# Patient Record
Sex: Male | Born: 1985 | Race: White | Hispanic: No | Marital: Married | Smoking: Current every day smoker
Health system: Southern US, Community
[De-identification: ages and names within clinical notes are randomized; demographics above are authoritative.]

## PROBLEM LIST (undated history)

## (undated) DIAGNOSIS — R12 Heartburn: Secondary | ICD-10-CM

## (undated) DIAGNOSIS — L309 Dermatitis, unspecified: Secondary | ICD-10-CM

---

## 2017-08-19 ENCOUNTER — Inpatient Hospital Stay (HOSPITAL_COMMUNITY): Payer: PRIVATE HEALTH INSURANCE

## 2017-08-19 ENCOUNTER — Inpatient Hospital Stay (HOSPITAL_COMMUNITY)
Admission: EM | Admit: 2017-08-19 | Discharge: 2017-08-26 | DRG: 682 | Disposition: A | Discharge status: Home / Self Care | Payer: PRIVATE HEALTH INSURANCE | Attending: Family Medicine | Admitting: Family Medicine

## 2017-08-19 ENCOUNTER — Emergency Department (HOSPITAL_COMMUNITY): Payer: PRIVATE HEALTH INSURANCE

## 2017-08-19 ENCOUNTER — Encounter (HOSPITAL_COMMUNITY): Payer: Self-pay | Admitting: Emergency Medicine

## 2017-08-19 DIAGNOSIS — R579 Shock, unspecified: Secondary | ICD-10-CM | POA: Diagnosis present

## 2017-08-19 DIAGNOSIS — E872 Acidosis, unspecified: Secondary | ICD-10-CM

## 2017-08-19 DIAGNOSIS — R0603 Acute respiratory distress: Secondary | ICD-10-CM

## 2017-08-19 DIAGNOSIS — R0789 Other chest pain: Secondary | ICD-10-CM | POA: Diagnosis present

## 2017-08-19 DIAGNOSIS — L309 Dermatitis, unspecified: Secondary | ICD-10-CM | POA: Diagnosis present

## 2017-08-19 DIAGNOSIS — R17 Unspecified jaundice: Secondary | ICD-10-CM | POA: Diagnosis not present

## 2017-08-19 DIAGNOSIS — D696 Thrombocytopenia, unspecified: Secondary | ICD-10-CM | POA: Diagnosis present

## 2017-08-19 DIAGNOSIS — J9600 Acute respiratory failure, unspecified whether with hypoxia or hypercapnia: Secondary | ICD-10-CM

## 2017-08-19 DIAGNOSIS — Z992 Dependence on renal dialysis: Secondary | ICD-10-CM

## 2017-08-19 DIAGNOSIS — N179 Acute kidney failure, unspecified: Secondary | ICD-10-CM | POA: Diagnosis present

## 2017-08-19 DIAGNOSIS — K219 Gastro-esophageal reflux disease without esophagitis: Secondary | ICD-10-CM | POA: Diagnosis present

## 2017-08-19 DIAGNOSIS — R778 Other specified abnormalities of plasma proteins: Secondary | ICD-10-CM

## 2017-08-19 DIAGNOSIS — R112 Nausea with vomiting, unspecified: Secondary | ICD-10-CM

## 2017-08-19 DIAGNOSIS — E871 Hypo-osmolality and hyponatremia: Secondary | ICD-10-CM | POA: Diagnosis present

## 2017-08-19 DIAGNOSIS — I340 Nonrheumatic mitral (valve) insufficiency: Secondary | ICD-10-CM | POA: Diagnosis present

## 2017-08-19 DIAGNOSIS — D649 Anemia, unspecified: Secondary | ICD-10-CM

## 2017-08-19 DIAGNOSIS — R12 Heartburn: Secondary | ICD-10-CM | POA: Diagnosis present

## 2017-08-19 DIAGNOSIS — Z452 Encounter for adjustment and management of vascular access device: Secondary | ICD-10-CM

## 2017-08-19 DIAGNOSIS — K7460 Unspecified cirrhosis of liver: Secondary | ICD-10-CM | POA: Diagnosis present

## 2017-08-19 DIAGNOSIS — F17210 Nicotine dependence, cigarettes, uncomplicated: Secondary | ICD-10-CM | POA: Diagnosis present

## 2017-08-19 DIAGNOSIS — R748 Abnormal levels of other serum enzymes: Secondary | ICD-10-CM | POA: Diagnosis not present

## 2017-08-19 DIAGNOSIS — E875 Hyperkalemia: Secondary | ICD-10-CM | POA: Diagnosis present

## 2017-08-19 DIAGNOSIS — I514 Myocarditis, unspecified: Secondary | ICD-10-CM | POA: Diagnosis present

## 2017-08-19 DIAGNOSIS — K7200 Acute and subacute hepatic failure without coma: Secondary | ICD-10-CM | POA: Diagnosis present

## 2017-08-19 DIAGNOSIS — Z79891 Long term (current) use of opiate analgesic: Secondary | ICD-10-CM | POA: Diagnosis not present

## 2017-08-19 DIAGNOSIS — R7989 Other specified abnormal findings of blood chemistry: Secondary | ICD-10-CM

## 2017-08-19 DIAGNOSIS — E8770 Fluid overload, unspecified: Secondary | ICD-10-CM | POA: Diagnosis present

## 2017-08-19 DIAGNOSIS — R197 Diarrhea, unspecified: Secondary | ICD-10-CM

## 2017-08-19 DIAGNOSIS — I959 Hypotension, unspecified: Secondary | ICD-10-CM

## 2017-08-19 DIAGNOSIS — K7011 Alcoholic hepatitis with ascites: Secondary | ICD-10-CM | POA: Diagnosis present

## 2017-08-19 HISTORY — DX: Dermatitis, unspecified: L30.9

## 2017-08-19 HISTORY — DX: Heartburn: R12

## 2017-08-19 LAB — BASIC METABOLIC PANEL
Anion gap: 36 — ABNORMAL HIGH (ref 5–15)
Anion gap: 25 — ABNORMAL HIGH (ref 5–15)
BUN: 62 mg/dL — AB (ref 6–20)
BUN: 64 mg/dL — AB (ref 6–20)
CHLORIDE: 68 mmol/L — AB (ref 101–111)
CO2: 8 mmol/L — AB (ref 22–32)
CO2: 22 mmol/L (ref 22–32)
CREATININE: 5.9 mg/dL — AB (ref 0.61–1.24)
CREATININE: 5.72 mg/dL — AB (ref 0.61–1.24)
Calcium: 9.6 mg/dL (ref 8.9–10.3)
Calcium: 8.8 mg/dL — ABNORMAL LOW (ref 8.9–10.3)
Chloride: 70 mmol/L — ABNORMAL LOW (ref 101–111)
GFR calc Af Amer: 14 mL/min — ABNORMAL LOW (ref >60)
GFR calc non Af Amer: 12 mL/min — ABNORMAL LOW (ref >60)
GFR, EST AFRICAN AMERICAN: 13 mL/min — AB (ref >60)
GFR, EST NON AFRICAN AMERICAN: 12 mL/min — AB (ref >60)
GLUCOSE: 162 mg/dL — AB (ref 65–99)
Glucose, Bld: 122 mg/dL — ABNORMAL HIGH (ref 65–99)
Potassium: 6.3 mmol/L (ref 3.5–5.1)
Potassium: 4.5 mmol/L (ref 3.5–5.1)
SODIUM: 117 mmol/L — AB (ref 135–145)
Sodium: 112 mmol/L — CL (ref 135–145)

## 2017-08-19 LAB — CBC WITH DIFFERENTIAL/PLATELET
BASOS ABS: 0 10*3/uL (ref 0.0–0.1)
Basophils Relative: 0 %
EOS PCT: 0 %
Eosinophils Absolute: 0 10*3/uL (ref 0.0–0.7)
HEMATOCRIT: 34.9 % — AB (ref 39.0–52.0)
HEMOGLOBIN: 12.2 g/dL — AB (ref 13.0–17.0)
LYMPHS ABS: 1.3 10*3/uL (ref 0.7–4.0)
LYMPHS PCT: 8 %
MCH: 34.3 pg — AB (ref 26.0–34.0)
MCHC: 35 g/dL (ref 30.0–36.0)
MCV: 98 fL (ref 78.0–100.0)
Monocytes Absolute: 1.5 10*3/uL — ABNORMAL HIGH (ref 0.1–1.0)
Monocytes Relative: 9 %
NEUTROS ABS: 12.8 10*3/uL — AB (ref 1.7–7.7)
Neutrophils Relative %: 83 %
PLATELETS: 348 10*3/uL (ref 150–400)
RBC: 3.56 MIL/uL — AB (ref 4.22–5.81)
RDW: 13.4 % (ref 11.5–15.5)
WBC: 15.5 10*3/uL — AB (ref 4.0–10.5)

## 2017-08-19 LAB — APTT: APTT: 40 s — AB (ref 24–36)

## 2017-08-19 LAB — POCT I-STAT 3, ART BLOOD GAS (G3+)
ACID-BASE DEFICIT: 5 mmol/L — AB (ref 0.0–2.0)
BICARBONATE: 14.3 mmol/L — AB (ref 20.0–28.0)
O2 SAT: 99 %
PCO2 ART: 16.8 mmHg — AB (ref 32.0–48.0)
PO2 ART: 109 mmHg — AB (ref 83.0–108.0)
Patient temperature: 36.7
TCO2: 15 mmol/L — ABNORMAL LOW (ref 22–32)
pH, Arterial: 7.537 — ABNORMAL HIGH (ref 7.350–7.450)

## 2017-08-19 LAB — COMPREHENSIVE METABOLIC PANEL
AST: <5 U/L — ABNORMAL LOW (ref 15–41)
Albumin: 3.4 g/dL — ABNORMAL LOW (ref 3.5–5.0)
Alkaline Phosphatase: 60 U/L (ref 38–126)
BUN: 59 mg/dL — ABNORMAL HIGH (ref 6–20)
CALCIUM: 9.9 mg/dL (ref 8.9–10.3)
CHLORIDE: 69 mmol/L — AB (ref 101–111)
Creatinine, Ser: 5.89 mg/dL — ABNORMAL HIGH (ref 0.61–1.24)
GFR calc non Af Amer: 12 mL/min — ABNORMAL LOW (ref >60)
GFR, EST AFRICAN AMERICAN: 13 mL/min — AB (ref >60)
GLUCOSE: 107 mg/dL — AB (ref 65–99)
Potassium: 5.7 mmol/L — ABNORMAL HIGH (ref 3.5–5.1)
SODIUM: 113 mmol/L — AB (ref 135–145)
Total Bilirubin: 8.8 mg/dL — ABNORMAL HIGH (ref 0.3–1.2)
Total Protein: 6.7 g/dL (ref 6.5–8.1)

## 2017-08-19 LAB — I-STAT CG4 LACTIC ACID, ED
Lactic Acid, Venous: >17 mmol/L (ref 0.5–1.9)
Lactic Acid, Venous: >17 mmol/L (ref 0.5–1.9)

## 2017-08-19 LAB — AMMONIA: Ammonia: 54 umol/L — ABNORMAL HIGH (ref 9–35)

## 2017-08-19 LAB — OSMOLALITY: OSMOLALITY: 268 mosm/kg — AB (ref 275–295)

## 2017-08-19 LAB — PROTIME-INR
INR: 2.42
Prothrombin Time: 26.1 seconds — ABNORMAL HIGH (ref 11.4–15.2)

## 2017-08-19 LAB — ABO/RH: ABO/RH(D): A POS

## 2017-08-19 LAB — I-STAT TROPONIN, ED: Troponin i, poc: 0.27 ng/mL (ref 0.00–0.08)

## 2017-08-19 LAB — DIC (DISSEMINATED INTRAVASCULAR COAGULATION)PANEL
D-Dimer, Quant: 1.44 ug/mL-FEU — ABNORMAL HIGH (ref 0.00–0.50)
Platelets: 317 10*3/uL (ref 150–400)
aPTT: 41 seconds — ABNORMAL HIGH (ref 24–36)

## 2017-08-19 LAB — TYPE AND SCREEN
ABO/RH(D): A POS
ANTIBODY SCREEN: NEGATIVE

## 2017-08-19 LAB — GLUCOSE, CAPILLARY
GLUCOSE-CAPILLARY: 150 mg/dL — AB (ref 65–99)
Glucose-Capillary: 125 mg/dL — ABNORMAL HIGH (ref 65–99)

## 2017-08-19 LAB — ACETAMINOPHEN LEVEL: Acetaminophen (Tylenol), Serum: <10 ug/mL — ABNORMAL LOW (ref 10–30)

## 2017-08-19 LAB — DIC (DISSEMINATED INTRAVASCULAR COAGULATION) PANEL
FIBRINOGEN: 156 mg/dL — AB (ref 210–475)
INR: 2.53
PROTHROMBIN TIME: 27 s — AB (ref 11.4–15.2)
SMEAR REVIEW: NONE SEEN

## 2017-08-19 LAB — MAGNESIUM: Magnesium: 2 mg/dL (ref 1.7–2.4)

## 2017-08-19 LAB — PHOSPHORUS: Phosphorus: 8.7 mg/dL — ABNORMAL HIGH (ref 2.5–4.6)

## 2017-08-19 LAB — MRSA PCR SCREENING: MRSA BY PCR: NEGATIVE

## 2017-08-19 LAB — BILIRUBIN, DIRECT: BILIRUBIN DIRECT: 4 mg/dL — AB (ref 0.1–0.5)

## 2017-08-19 LAB — LACTATE DEHYDROGENASE: LDH: 422 U/L — AB (ref 98–192)

## 2017-08-19 LAB — LIPASE, BLOOD: LIPASE: 84 U/L — AB (ref 11–51)

## 2017-08-19 LAB — BRAIN NATRIURETIC PEPTIDE: B NATRIURETIC PEPTIDE 5: 3252.8 pg/mL — AB (ref 0.0–100.0)

## 2017-08-19 LAB — ETHANOL: Alcohol, Ethyl (B): <5 mg/dL (ref <5)

## 2017-08-19 MED ORDER — VITAMIN K1 10 MG/ML IJ SOLN
10.0000 mg | Freq: Once | INTRAVENOUS | Status: AC
  Administered 2017-08-19: 10 mg via INTRAVENOUS
  Filled 2017-08-19: qty 1

## 2017-08-19 MED ORDER — ACETYLCYSTEINE LOAD VIA INFUSION
150.0000 mg/kg | Freq: Once | INTRAVENOUS | Status: DC
Start: 2017-08-19 — End: 2017-08-19

## 2017-08-19 MED ORDER — DEXTROSE 5 % IV SOLN
INTRAVENOUS | Status: DC
  Filled 2017-08-19: qty 1000

## 2017-08-19 MED ORDER — FENTANYL CITRATE (PF) 100 MCG/2ML IJ SOLN
75.0000 ug | INTRAMUSCULAR | Status: DC | PRN
  Administered 2017-08-19 (×2): 75 ug via INTRAVENOUS
  Administered 2017-08-19: 25 ug via INTRAVENOUS
  Administered 2017-08-19 – 2017-08-23 (×17): 75 ug via INTRAVENOUS
  Filled 2017-08-19 (×21): qty 2

## 2017-08-19 MED ORDER — DEXTROSE 5 % IV SOLN: 12.5000 mg/kg/h | INTRAVENOUS | Status: DC

## 2017-08-19 MED ORDER — ACETYLCYSTEINE LOAD VIA INFUSION: 150.0000 mg/kg | Freq: Once | INTRAVENOUS | Status: DC

## 2017-08-19 MED ORDER — FAMOTIDINE IN NACL 20-0.9 MG/50ML-% IV SOLN
20.0000 mg | Freq: Once | INTRAVENOUS | Status: AC
Start: 2017-08-19 — End: 2017-08-19
  Administered 2017-08-19: 20 mg via INTRAVENOUS
  Filled 2017-08-19: qty 50

## 2017-08-19 MED ORDER — SODIUM CHLORIDE 0.9 % IV SOLN
0.0000 ug/min | INTRAVENOUS | Status: DC
  Administered 2017-08-19 (×2): 280 ug/min via INTRAVENOUS
  Administered 2017-08-20: 30 ug/min via INTRAVENOUS
  Administered 2017-08-20: 260 ug/min via INTRAVENOUS
  Administered 2017-08-20: 230 ug/min via INTRAVENOUS
  Administered 2017-08-20: 170 ug/min via INTRAVENOUS
  Filled 2017-08-19 (×6): qty 4

## 2017-08-19 MED ORDER — PANTOPRAZOLE SODIUM 40 MG IV SOLR
40.0000 mg | Freq: Every day | INTRAVENOUS | Status: DC
  Administered 2017-08-19 – 2017-08-21 (×3): 40 mg via INTRAVENOUS
  Filled 2017-08-19 (×4): qty 40

## 2017-08-19 MED ORDER — ONDANSETRON HCL 4 MG/2ML IJ SOLN
4.0000 mg | Freq: Once | INTRAMUSCULAR | Status: AC
  Administered 2017-08-19: 4 mg via INTRAVENOUS
  Filled 2017-08-19: qty 2

## 2017-08-19 MED ORDER — ACETYLCYSTEINE LOAD VIA INFUSION
150.0000 mg/kg | Freq: Once | INTRAVENOUS | Status: AC
  Administered 2017-08-19: 10200 mg via INTRAVENOUS
  Filled 2017-08-19: qty 255

## 2017-08-19 MED ORDER — STERILE WATER FOR INJECTION IV SOLN
INTRAVENOUS | Status: DC
  Administered 2017-08-19: 15:00:00 via INTRAVENOUS
  Filled 2017-08-19 (×4): qty 850

## 2017-08-19 MED ORDER — TAB-A-VITE/IRON PO TABS
1.0000 | ORAL_TABLET | Freq: Every day | ORAL | Status: DC
  Administered 2017-08-21 – 2017-08-26 (×6): 1 via ORAL
  Filled 2017-08-19 (×8): qty 1

## 2017-08-19 MED ORDER — DEXTROSE 5 % IV SOLN
INTRAVENOUS | Status: AC
  Administered 2017-08-19 – 2017-08-22 (×2): via INTRAVENOUS
  Filled 2017-08-19 (×2): qty 800

## 2017-08-19 MED ORDER — DEXTROSE 5 % IV SOLN: 15.0000 mg/kg/h | INTRAVENOUS | Status: DC

## 2017-08-19 MED ORDER — SODIUM BICARBONATE 8.4 % IV SOLN
INTRAVENOUS | Status: AC
  Filled 2017-08-19: qty 50

## 2017-08-19 MED ORDER — DEXTROSE 5 % IV SOLN: 6.2500 mg/kg/h | INTRAVENOUS | Status: DC

## 2017-08-19 MED ORDER — SODIUM CHLORIDE 0.9 % IV SOLN
0.0000 ug/min | INTRAVENOUS | Status: DC
  Administered 2017-08-19: 280 ug/min via INTRAVENOUS
  Administered 2017-08-19: 100 ug/min via INTRAVENOUS
  Administered 2017-08-19: 280 ug/min via INTRAVENOUS
  Filled 2017-08-19 (×4): qty 1

## 2017-08-19 MED ORDER — DEXTROSE 5 % IV SOLN
INTRAVENOUS | Status: DC
  Filled 2017-08-19: qty 800

## 2017-08-19 MED ORDER — ACETYLCYSTEINE 200 MG/ML IV SOLN
INTRAVENOUS | Status: DC
  Filled 2017-08-19: qty 1000

## 2017-08-19 MED ORDER — SODIUM BICARBONATE 8.4 % IV SOLN
50.0000 meq | Freq: Once | INTRAVENOUS | Status: AC
  Administered 2017-08-19: 50 meq via INTRAVENOUS

## 2017-08-19 MED ORDER — ONDANSETRON HCL 4 MG/2ML IJ SOLN
4.0000 mg | Freq: Four times a day (QID) | INTRAMUSCULAR | Status: DC | PRN
  Administered 2017-08-19: 4 mg via INTRAVENOUS
  Filled 2017-08-19: qty 2

## 2017-08-19 MED ORDER — MORPHINE SULFATE (PF) 2 MG/ML IV SOLN
2.0000 mg | Freq: Once | INTRAVENOUS | Status: AC
  Administered 2017-08-19: 2 mg via INTRAVENOUS
  Filled 2017-08-19: qty 1

## 2017-08-19 MED ORDER — SODIUM CHLORIDE 0.9 % IV SOLN
250.0000 mL | INTRAVENOUS | Status: DC | PRN
  Administered 2017-08-21: 250 mL via INTRAVENOUS

## 2017-08-19 MED ORDER — FUROSEMIDE 10 MG/ML IJ SOLN: 40.0000 mg | Freq: Once | INTRAMUSCULAR | Status: DC

## 2017-08-19 MED ORDER — SODIUM BICARBONATE 8.4 % IV SOLN
INTRAVENOUS | Status: AC
  Filled 2017-08-19: qty 100

## 2017-08-19 MED ORDER — ACETYLCYSTEINE 200 MG/ML IV SOLN
INTRAVENOUS | Status: AC
  Filled 2017-08-19: qty 800

## 2017-08-19 MED ORDER — SODIUM CHLORIDE 0.9 % IV SOLN: 12.5000 mg | Freq: Once | INTRAVENOUS | Status: DC

## 2017-08-19 MED ORDER — THIAMINE HCL 100 MG/ML IJ SOLN
100.0000 mg | Freq: Every day | INTRAMUSCULAR | Status: DC
  Administered 2017-08-19 – 2017-08-24 (×6): 100 mg via INTRAVENOUS
  Filled 2017-08-19: qty 2
  Filled 2017-08-19 (×5): qty 1

## 2017-08-19 MED ORDER — SODIUM POLYSTYRENE SULFONATE 15 GM/60ML PO SUSP
30.0000 g | Freq: Once | ORAL | Status: AC
  Administered 2017-08-19: 30 g via RECTAL
  Filled 2017-08-19: qty 120

## 2017-08-19 MED ORDER — SODIUM BICARBONATE 8.4 % IV SOLN
100.0000 meq | Freq: Once | INTRAVENOUS | Status: DC
  Filled 2017-08-19: qty 100

## 2017-08-19 NOTE — Progress Notes (Signed)
RT tried patient on bi-pap and he was not able to tolerate it. Patient then placed on NRB and is tolerating well.

## 2017-08-19 NOTE — Consult Note (Signed)
Referring Provider: Dr. Pearline Cables Primary Care Physician:  Patient, No Pcp Per Primary Gastroenterologist:  Althia Forts  Reason for Consultation:  Jaundice/ abnormal LFT-INR   HPI: Seth Obrien is a 31 y.o. male ith no significant past medical history came into ED for for further evaluation of jaundice as well as lower ext  swelling and shortness of breath.initial evaluation revealed severe hyponatremia with sodium of 113,acute kidney injury with creatinine of 5.89, jaundice with total bilirubin of 8.8 ( Normal AST,ALT, Alk phos), elevated INR 2.42. GI is consulted for further evaluation.  Patient seen and examined at bedside. He started noticing lower extremity swelling around 4-6 weeks ago. Initially had some improvement in swelling but subsequently started noticing worsening swelling around 1-2 weeks ago.  started noticing yellow discoloration of eyes around 2 days ago. Patient is also complaining of new onset of chest pain along with shortness of breath which started today. Describe his chest pain as substernal pain which is pressure-like and sharp with radiation towards neck and  Left arm. also complaining of nausea, vomiting and loose stools. Denied any blood in the stool.  He had cut back on his alcohol use around 4-6 weeks ago. Used to drink 4-6 beers a day. Denied any recreational drug use.  No family history of liver disease. No history of colon cancer. Denied any new medications.  Past Medical History:  Diagnosis Date  . Eczema   . Heartburn     History reviewed. No pertinent surgical history.  Prior to Admission medications   Medication Sig Start Date End Date Taking? Authorizing Provider  acetaminophen (TYLENOL) 500 MG tablet Take 1,000 mg by mouth every 6 (six) hours as needed for moderate pain or headache.   Yes [provider]  Buprenorphine HCl-Naloxone HCl (SUBOXONE) 8-2 MG FILM Place 1 Film under the tongue 2 (two) times daily.   Yes [provider]   omeprazole (PRILOSEC OTC) 20 MG tablet Take 20 mg by mouth daily as needed (for acid reflux).   Yes [provider]    Scheduled Meds: . sodium bicarbonate      . acetylcysteine  150 mg/kg Intravenous Once  . multivitamins with iron  1 tablet Oral Daily  . pantoprazole (PROTONIX) IV  40 mg Intravenous QHS  . thiamine injection  100 mg Intravenous Daily   Continuous Infusions: . sodium chloride    . dextrose 5 % 800 mL with acetylcysteine (ACETADOTE) 40,000 mg infusion     Followed by  . dextrose 5 % 800 mL with acetylcysteine (ACETADOTE) 40,000 mg infusion    . phytonadione (VITAMIN K) IV 10 mg (08/19/17 1530)  .  sodium bicarbonate (isotonic) infusion in sterile water 50 mL/hr at 08/19/17 1514   PRN Meds:.sodium chloride, ondansetron (ZOFRAN) IV  Allergies as of 08/19/2017  . (No Known Allergies)    No family history on file.  Social History   Social History  . Marital status: Married    Spouse name: N/A  . Number of children: N/A  . Years of education: N/A   Occupational History  . Not on file.   Social History Main Topics  . Smoking status: Current Every Day Smoker    Packs/day: 1.00  . Smokeless tobacco: Never Used  . Alcohol use Yes     Comment: 3-4 drinks a day  . Drug use: No     Comment: suboxone  . Sexual activity: Not on file   Other Topics Concern  . Not on file   Social  History Narrative  . No narrative on file    Review of Systems: Review of Systems  Constitutional: Positive for malaise/fatigue. Negative for chills and fever.  HENT: Negative for hearing loss and tinnitus.   Eyes: Negative for blurred vision and double vision.  Respiratory: Positive for shortness of breath. Negative for cough and hemoptysis.   Cardiovascular: Positive for chest pain, orthopnea and leg swelling.  Gastrointestinal: Positive for heartburn, nausea and vomiting. Negative for abdominal pain, blood in stool and melena.  Genitourinary: Negative for dysuria  and urgency.  Musculoskeletal: Positive for back pain and neck pain.  Skin: Negative for itching and rash.  Neurological: Positive for weakness. Negative for seizures and loss of consciousness.  Endo/Heme/Allergies: Does not bruise/bleed easily.  Psychiatric/Behavioral: Negative for hallucinations and substance abuse.    Physical Exam: Vital signs: Vitals:   08/19/17 1330 08/19/17 1400  BP: (!) 97/39 (!) 98/41  Pulse: 90 86  Resp: 19 19  Temp:    SpO2: 100% 100%   Last BM Date: 08/19/17 Physical Exam  Constitutional: He is oriented to person, place, and time. He appears well-developed and well-nourished. He appears distressed.  HENT:  Head: Normocephalic and atraumatic.  Mouth/Throat: No oropharyngeal exudate.  Eyes: EOM are normal. Scleral icterus is present.  Neck: Normal range of motion. Neck supple. No thyromegaly present.  Cardiovascular: Normal rate, regular rhythm and normal heart sounds.   Pulmonary/Chest: Breath sounds normal. He is in respiratory distress.  Abdominal: Soft. Bowel sounds are normal. He exhibits no distension. There is no rebound and no guarding.  Hepatomegaly noted. Has mild discomfort in the epigastric area  Musculoskeletal: He exhibits edema. He exhibits no tenderness.  Neurological: He is alert and oriented to person, place, and time.  Skin: Skin is warm. No erythema.  Psychiatric: He has a normal mood and affect. His behavior is normal.    GI:  Lab Results:  Recent Labs  08/19/17 1030 08/19/17 1240  WBC 15.5*  --   HGB 12.2*  --   HCT 34.9*  --   PLT 348 317   BMET  Recent Labs  08/19/17 1030 08/19/17 1421  NA 113* 112*  K 5.7* 6.3*  CL 69* 68*  CO2 <7* 8*  GLUCOSE 107* 122*  BUN 59* 62*  CREATININE 5.89* 5.90*  CALCIUM 9.9 9.6   LFT  Recent Labs  08/19/17 1030 08/19/17 1240  PROT 6.7  --   ALBUMIN 3.4*  --   AST <5*  --   ALT <5*  --   ALKPHOS 60  --   BILITOT 8.8*  --   BILIDIR  --  4.0*   PT/INR  Recent  Labs  08/19/17 1030 08/19/17 1240  LABPROT 26.1* 27.0*  INR 2.42 2.53     Studies/Results: Ct Abdomen Pelvis Wo Contrast  Result Date: 08/19/2017 CLINICAL DATA:  New onset jaundice to sclera, swelling ankles. Shortness of breath. EXAM: CT CHEST, ABDOMEN AND PELVIS WITHOUT CONTRAST TECHNIQUE: Multidetector CT imaging of the chest, abdomen and pelvis was performed following the standard protocol without IV contrast. COMPARISON:  None. FINDINGS: CT CHEST FINDINGS Cardiovascular: Heart size is upper normal. No pericardial effusion. No thoracic aortic aneurysm. Mediastinum/Nodes: No mass or enlarged lymph nodes seen within the mediastinum or perihilar regions. Esophagus is unremarkable. Trachea and central bronchi are unremarkable. Lungs/Pleura: Small right pleural effusion. Lungs are otherwise clear. No pneumonia or pulmonary edema. No pneumothorax. Musculoskeletal: No acute or significant osseous findings. CT ABDOMEN PELVIS FINDINGS Hepatobiliary: Liver is diffusely/markedly  low in density suggesting fatty infiltration, alternatively edematous related to hepatitis. No focal liver mass or lesion seen. Gallbladder is unremarkable.  No bile duct dilatation. Pancreas: Unremarkable. No pancreatic ductal dilatation or surrounding inflammatory changes. Spleen: Normal in size without focal abnormality. Adrenals/Urinary Tract: Adrenal glands are unremarkable. Kidneys are unremarkable without mass, stone or hydronephrosis. Bladder is decompressed. Stomach/Bowel: Bowel is normal in caliber. No bowel wall thickening or evidence of bowel wall inflammation. Vascular/Lymphatic: No significant vascular findings are present. No enlarged abdominal or pelvic lymph nodes. Reproductive: Prostate is unremarkable. Other: Small amount of free fluid and fluid stranding within the lower pelvis. No circumscribed fluid collection or abscess like collection. No free fluid appreciated within the upper abdomen. Musculoskeletal:  Ill-defined fluid/edema throughout the superficial soft tissues indicating anasarca. Associated edematous scrotal wall thickening. No acute or significant osseous finding. IMPRESSION: 1. Liver is diffusely/markedly low in density suggesting either fatty infiltration or edema related to hepatitis or other infectious/inflammatory process. There is associated hepatomegaly (right liver lobe measures 21 cm length). No focal mass or lesion appreciated within the liver. 2. Gallbladder is unremarkable.  No bile duct dilatation. 3. Anasarca. 4. Scrotal wall thickening (edematous), likely related to the anasarca. 5. Small amount of free fluid/fluid stranding in the lower pelvis. No abscess collection. 6. Small right pleural effusion. Lungs otherwise clear. No pneumonia or pulmonary edema. Electronically Signed   By: Franki Cabot M.D.   On: 08/19/2017 12:17   Ct Chest Wo Contrast  Result Date: 08/19/2017 CLINICAL DATA:  New onset jaundice to sclera, swelling ankles. Shortness of breath. EXAM: CT CHEST, ABDOMEN AND PELVIS WITHOUT CONTRAST TECHNIQUE: Multidetector CT imaging of the chest, abdomen and pelvis was performed following the standard protocol without IV contrast. COMPARISON:  None. FINDINGS: CT CHEST FINDINGS Cardiovascular: Heart size is upper normal. No pericardial effusion. No thoracic aortic aneurysm. Mediastinum/Nodes: No mass or enlarged lymph nodes seen within the mediastinum or perihilar regions. Esophagus is unremarkable. Trachea and central bronchi are unremarkable. Lungs/Pleura: Small right pleural effusion. Lungs are otherwise clear. No pneumonia or pulmonary edema. No pneumothorax. Musculoskeletal: No acute or significant osseous findings. CT ABDOMEN PELVIS FINDINGS Hepatobiliary: Liver is diffusely/markedly low in density suggesting fatty infiltration, alternatively edematous related to hepatitis. No focal liver mass or lesion seen. Gallbladder is unremarkable.  No bile duct dilatation. Pancreas:  Unremarkable. No pancreatic ductal dilatation or surrounding inflammatory changes. Spleen: Normal in size without focal abnormality. Adrenals/Urinary Tract: Adrenal glands are unremarkable. Kidneys are unremarkable without mass, stone or hydronephrosis. Bladder is decompressed. Stomach/Bowel: Bowel is normal in caliber. No bowel wall thickening or evidence of bowel wall inflammation. Vascular/Lymphatic: No significant vascular findings are present. No enlarged abdominal or pelvic lymph nodes. Reproductive: Prostate is unremarkable. Other: Small amount of free fluid and fluid stranding within the lower pelvis. No circumscribed fluid collection or abscess like collection. No free fluid appreciated within the upper abdomen. Musculoskeletal: Ill-defined fluid/edema throughout the superficial soft tissues indicating anasarca. Associated edematous scrotal wall thickening. No acute or significant osseous finding. IMPRESSION: 1. Liver is diffusely/markedly low in density suggesting either fatty infiltration or edema related to hepatitis or other infectious/inflammatory process. There is associated hepatomegaly (right liver lobe measures 21 cm length). No focal mass or lesion appreciated within the liver. 2. Gallbladder is unremarkable.  No bile duct dilatation. 3. Anasarca. 4. Scrotal wall thickening (edematous), likely related to the anasarca. 5. Small amount of free fluid/fluid stranding in the lower pelvis. No abscess collection. 6. Small right pleural effusion. Lungs otherwise  clear. No pneumonia or pulmonary edema. Electronically Signed   By: Franki Cabot M.D.   On: 08/19/2017 12:17   Dg Chest Portable 1 View  Result Date: 08/19/2017 CLINICAL DATA:  Patient with recent onset jaundice. EXAM: PORTABLE CHEST 1 VIEW COMPARISON:  None. FINDINGS: Monitoring leads overlie the patient. Cardiac contours upper limits of normal. No consolidative pulmonary opacities. No pleural effusion or pneumothorax. IMPRESSION: Mild  cardiomegaly.  No acute cardiopulmonary process. Electronically Signed   By: Lovey Newcomer M.D.   On: 08/19/2017 10:54    Impression/Plan: - Jaundice with total bilirubin of 8.8 with normal AST, ALT and alkaline phosphatase. Patient with acute kidney injury, hypotension, lower extremity edema and ongoing chest pain with shortness of breath. His presentation is suspicious for underlying ischemic disease. - Elevated INR. Could be from recent ischemic injury to liver - Chest pain and shortness of breath with mild elevated troponins. - Hypotension - Lower extremity edema. - Acute kidney injury - Severe Hyponatremia  Recommendations -------------------------- - Patient is having acute left-sided chest pain with radiation towards left extremity along with shortness of breath. I think underlying ischemic heart disease needs to be ruled out ASAP.D/W  this with cardiology Np - [place].  - Patient's elevated bilirubin could be from underlying acute kidney injury which prevents clearance of bilirubin. Patient is alert and oriented 3. He has no signs of hepatic encephalopathy. He has normal AST, ALT and alkaline phosphatase. Usually in acute liver failure, AST and ALT levels are more than thousand. He may have had an ischemic event which might have caused acute kidney injury as well as injury to his liver as patient has been having symptoms for last 4-6 weeks. I do not think patient has acute liver failure and mildly elevated INR could be result of ischemic injury to liver. I recommend to continue NAC for now. D/W CCM Dr. Pearline Cables. Recommend starting IV vitamin K. Follow ultrasound liver Doppler to rule out Budd-Chiari syndrome. Follow hepatitis panel. Tylenol level is normal. Negative blood alcohol level. - Repeat LFTs and INR in the morning.  Patient is critically ill with multiple comorbidities. Critical-care time spent is around 33 minutes. Case discussed with cardiology nurse practitioner as well as  critical-care attending.    LOS: 0 days   Otis Brace  MD, FACP 08/19/2017, 3:45 PM  Pager [phone] If no answer or after 5 PM call [phone]

## 2017-08-19 NOTE — Progress Notes (Signed)
eLink Physician-Brief Progress Note Patient Name: Seth HoyerCody Plotz DOB: 06/13/1986 MRN: [id number]   Date of Service  08/19/2017  HPI/Events of Note  Hypotension - ABG not obtained yet. However, I suspect that he is profoundly acidemic. No CVL. Ordered ABG now obtained yet.   eICU Interventions  Will order: 1. NaHCO3 100 meq IV now. 2. Phenylephrine IV infusion. Titrate to MAP > 65. 3. Keep trying to get ABG. 4. Will notify ground team of need for CVL.      Intervention Category Major Interventions: Hypotension - evaluation and management  Sommer,Steven Eugene 08/19/2017, 4:57 PM

## 2017-08-19 NOTE — H&P (Signed)
PULMONARY / CRITICAL CARE MEDICINE   Name: Seth Obrien MRN: [id number] DOB: 1986-10-06    ADMISSION DATE:  08/19/2017    CHIEF COMPLAINT:  Abdominal distention, jaundice, and dyspnea.  HISTORY OF PRESENT ILLNESS:   This is a 31 year old who normally enjoys normal active health. Approximately 2 weeks ago he started having difficulties with lower extremity swelling and in more recent days abdominal swelling which makes it so dispensed don't fit. He has noticed jaundice and fatigue, and is now suffering from nausea and vomiting. He is not having any abdominal pain. He has no known history of liver disease but does drink 3-4 hard ciders a day. He has a history of opiate abuse, but denies ever using needles in the past or having history of exposure to hepatitis.  PAST MEDICAL HISTORY :  He  has a past medical history of Eczema and Heartburn.  PAST SURGICAL HISTORY: He  has no past surgical history on file.  No Known Allergies   His been taking Suboxone under supervision for the past 2 years  No current facility-administered medications on file prior to encounter.    No current outpatient prescriptions on file prior to encounter.    FAMILY HISTORY:  He has no family history of early liver disease. [place] he is not aware of any problems with copper metabolism and has never heard of the words Wilson's disease.   SOCIAL HISTORY: He drinks alcohol as noted. He smokes 1/2-1 pack of cigarettes per day. Reports no use of street drugs for the past 2 years and denies ever having used needles. 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  REVIEW OF SYSTEMS:   10 system of the review of systems is remarkably positive. Positive findings include easy fatigue. Easy bruising with ecchymoses on the upper extremities. He has no prior history of shortness of breath no history of asthma bronchitis TB or hemoptysis. He has been  having some chest discomfort reasonably which she describes as going through to his back as well as epigastric discomfort which goes to his back as well. This is not described as terrible tearing pain. He otherwise has no history of known heart disease. He has had no difficulties with her abdominal discomfort . He is having some difficulties with nausea and diarrhea, his vomitus is described as watery and the diarrhea Green. He is no history of diabetes or thyroid disease. The remainder of the review of systems is negative   SUBJECTIVE:  As noted VITAL SIGNS: BP (!) 96/38   Pulse 93   Temp 97.7 F (36.5 C) (Oral)   Resp (!) 23   Ht  (1.778 m)   Wt 150 lb (68 kg)   SpO2 100%   BMI 21.52 kg/m   HEMODYNAMICS:    VENTILATOR SETTINGS:    INTAKE / OUTPUT: No intake/output data recorded.  PHYSICAL EXAMINATION: General: He is tachypneic and slightly labored but able to talk in complete sentences.  Neuro:  He cannot tell me the exact date but he can't tell me the day of the week and the month. Speech content is appropriate. Pupils are equal and reactive EOMs are full without unusual nystagmus. He has no asterixis or drift. HEENT:  He is remarkably jaundiced. Cardiovascular:  There is no significant JVD. S1 and S2 are regular without murmur rub or gallop. Resulted trace dependent edema. Lungs:  Respirations are rapid and slightly labored.  There is symmetric air movement no wheezes a few scattered rales. Abdomen:  Abdomen is only slightly distended, the liver is overtly enlarged with a palpable smooth firm edge. There is no tenderness guarding or rebound. I don't appreciate any splenomegaly. Musculoskeletal:  He has ecchymoses over both shoulders.   LABS:  BMET  Recent Labs Lab 08/19/17 1030  NA 113*  K 5.7*  CL 69*  CO2 <7*  BUN 59*  CREATININE 5.89*  GLUCOSE 107*    Electrolytes  Recent Labs Lab 08/19/17 1030  CALCIUM 9.9    CBC  Recent Labs Lab  08/19/17 1030  WBC 15.5*  HGB 12.2*  HCT 34.9*  PLT 348    Coag's  Recent Labs Lab 08/19/17 1030  APTT 40*  INR 2.42    Sepsis Markers  Recent Labs Lab 08/19/17 1116  LATICACIDVEN >17.00*    ABG  Recent Labs Lab 08/19/17 1130  PHART 7.410  PCO2ART CRITICAL RESULT CALLED TO, READ BACK BY AND VERIFIED WITH:  PO2ART 275*    Liver Enzymes  Recent Labs Lab 08/19/17 1030  AST <5*  ALT <5*  ALKPHOS 60  BILITOT 8.8*  ALBUMIN 3.4*    Cardiac Enzymes No results for input(s): TROPONINI, PROBNP in the last 168 hours.  Glucose No results for input(s): GLUCAP in the last 168 hours.  Imaging Ct Abdomen Pelvis Wo Contrast  Result Date: 08/19/2017 CLINICAL DATA:  New onset jaundice to sclera, swelling ankles. Shortness of breath. EXAM: CT CHEST, ABDOMEN AND PELVIS WITHOUT CONTRAST TECHNIQUE: Multidetector CT imaging of the chest, abdomen and pelvis was performed following the standard protocol without IV contrast. COMPARISON:  None. FINDINGS: CT CHEST FINDINGS Cardiovascular: Heart size is upper normal. No pericardial effusion. No thoracic aortic aneurysm. Mediastinum/Nodes: No mass or enlarged lymph nodes seen within the mediastinum or perihilar regions. Esophagus is unremarkable. Trachea and central bronchi are unremarkable. Lungs/Pleura: Small right pleural effusion. Lungs are otherwise clear. No pneumonia or pulmonary edema. No pneumothorax. Musculoskeletal: No acute or significant osseous findings. CT ABDOMEN PELVIS FINDINGS Hepatobiliary: Liver is diffusely/markedly low in density suggesting fatty infiltration, alternatively edematous related to hepatitis. No focal liver mass or lesion seen. Gallbladder is unremarkable.  No bile duct dilatation. Pancreas: Unremarkable. No pancreatic ductal dilatation or surrounding inflammatory changes. Spleen: Normal in size without focal abnormality. Adrenals/Urinary Tract: Adrenal glands are unremarkable. Kidneys are unremarkable  without mass, stone or hydronephrosis. Bladder is decompressed. Stomach/Bowel: Bowel is normal in caliber. No bowel wall thickening or evidence of bowel wall inflammation. Vascular/Lymphatic: No significant vascular findings are present. No enlarged abdominal or pelvic lymph nodes. Reproductive: Prostate is unremarkable. Other: Small amount of free fluid and fluid stranding within the lower pelvis. No circumscribed fluid collection or abscess like collection. No free fluid appreciated within the upper abdomen. Musculoskeletal: Ill-defined fluid/edema throughout the superficial soft tissues indicating anasarca. Associated edematous scrotal wall thickening. No acute or significant osseous finding. IMPRESSION: 1. Liver is diffusely/markedly low in density suggesting either fatty infiltration or edema related to hepatitis or other infectious/inflammatory process. There is associated hepatomegaly (right liver lobe measures 21 cm length). No focal mass or lesion appreciated within the liver. 2. Gallbladder is unremarkable.  No bile duct dilatation. 3. Anasarca. 4. Scrotal wall thickening (edematous), likely related to the anasarca. 5. Small amount of free fluid/fluid stranding in the lower pelvis. No abscess collection. 6. Small right pleural effusion. Lungs otherwise clear. No pneumonia or pulmonary edema. Electronically Signed   By: Bary Richard M.D.   On:  08/19/2017 12:17   Ct Chest Wo Contrast  Result Date: 08/19/2017 CLINICAL DATA:  New onset jaundice to sclera, swelling ankles. Shortness of breath. EXAM: CT CHEST, ABDOMEN AND PELVIS WITHOUT CONTRAST TECHNIQUE: Multidetector CT imaging of the chest, abdomen and pelvis was performed following the standard protocol without IV contrast. COMPARISON:  None. FINDINGS: CT CHEST FINDINGS Cardiovascular: Heart size is upper normal. No pericardial effusion. No thoracic aortic aneurysm. Mediastinum/Nodes: No mass or enlarged lymph nodes seen within the mediastinum or  perihilar regions. Esophagus is unremarkable. Trachea and central bronchi are unremarkable. Lungs/Pleura: Small right pleural effusion. Lungs are otherwise clear. No pneumonia or pulmonary edema. No pneumothorax. Musculoskeletal: No acute or significant osseous findings. CT ABDOMEN PELVIS FINDINGS Hepatobiliary: Liver is diffusely/markedly low in density suggesting fatty infiltration, alternatively edematous related to hepatitis. No focal liver mass or lesion seen. Gallbladder is unremarkable.  No bile duct dilatation. Pancreas: Unremarkable. No pancreatic ductal dilatation or surrounding inflammatory changes. Spleen: Normal in size without focal abnormality. Adrenals/Urinary Tract: Adrenal glands are unremarkable. Kidneys are unremarkable without mass, stone or hydronephrosis. Bladder is decompressed. Stomach/Bowel: Bowel is normal in caliber. No bowel wall thickening or evidence of bowel wall inflammation. Vascular/Lymphatic: No significant vascular findings are present. No enlarged abdominal or pelvic lymph nodes. Reproductive: Prostate is unremarkable. Other: Small amount of free fluid and fluid stranding within the lower pelvis. No circumscribed fluid collection or abscess like collection. No free fluid appreciated within the upper abdomen. Musculoskeletal: Ill-defined fluid/edema throughout the superficial soft tissues indicating anasarca. Associated edematous scrotal wall thickening. No acute or significant osseous finding. IMPRESSION: 1. Liver is diffusely/markedly low in density suggesting either fatty infiltration or edema related to hepatitis or other infectious/inflammatory process. There is associated hepatomegaly (right liver lobe measures 21 cm length). No focal mass or lesion appreciated within the liver. 2. Gallbladder is unremarkable.  No bile duct dilatation. 3. Anasarca. 4. Scrotal wall thickening (edematous), likely related to the anasarca. 5. Small amount of free fluid/fluid stranding in the  lower pelvis. No abscess collection. 6. Small right pleural effusion. Lungs otherwise clear. No pneumonia or pulmonary edema. Electronically Signed   By: Bary RichardStan  Maynard M.D.   On: 08/19/2017 12:17   Dg Chest Portable 1 View  Result Date: 08/19/2017 CLINICAL DATA:  Patient with recent onset jaundice. EXAM: PORTABLE CHEST 1 VIEW COMPARISON:  None. FINDINGS: Monitoring leads overlie the patient. Cardiac contours upper limits of normal. No consolidative pulmonary opacities. No pleural effusion or pneumothorax. IMPRESSION: Mild cardiomegaly.  No acute cardiopulmonary process. Electronically Signed   By: Annia Beltrew  Davis M.D.   On: 08/19/2017 10:54     STUDIES:  Right upper quadrant ultrasound with Doppler to evaluate portal flow is pending  CULTURES:  ANTIBIOTICS:  SIGNIFICANT EVENTS:   LINES/TUBES:    ASSESSMENT / PLAN:   PULMONARY Acute hepatic failure with associated derangements and electrolytes and renal function as well as a remarkably elevated lactic acid. The provocation for the hepatic dysfunction is not overt. An ultrasound to rule out acute total vein thrombosis is pending as is an acute hepatitis panel. A Tylenol level is pending and I started him on Mucomyst not as an antidote for Tylenol, but as an adjunct for liver recovery. An echocardiogram is pending to rule out acute right-sided heart failure as the provocation for his hepatic dysfunction but I very much doubt this based on my exam. We will be consulting GI and potentially nephrology should he fail to show any improvement in his renal function. Pertinent to  his shortness of breath his oxygenation is actually quite good, dyspnea is secondary to his extreme metabolic acidosis, and I have started him on a bicarbonate infusion.  Greater than 35 minutes spent in the care of this acutely ill patient today     Penny Pia, MD Pulmonary and Critical Care Medicine [facility] Pager: [phone]  08/19/2017, 12:49 PM

## 2017-08-19 NOTE — ED Notes (Signed)
Critical care at bedside  

## 2017-08-19 NOTE — Progress Notes (Signed)
CVP catheter placed for the infusion of pressors and multiple incompatible meds  Procedure explained with wife present. Time out performed. Sterile prep with Chlorhexidine. Area over left subclavian widely drapped and sterile garb donned. Subclavian vein easily cannulated and a wire gently passed. The tract was dilated, and a 20 cm 7FR triple lumen catheter placed to 20 cm. Good flow from all ports. Sterile dressing applied.  CXR pending

## 2017-08-19 NOTE — ED Triage Notes (Signed)
New onset jaundice to sclera -- swelling to ankles/abd/ shortness of breath. Pale-- started a few days ago,

## 2017-08-19 NOTE — ED Notes (Signed)
Returned from CT.

## 2017-08-19 NOTE — ED Provider Notes (Signed)
[facility] DEPT Provider Note   CSN: [id number] Arrival date & time: 08/19/17  1002     History   Chief Complaint Chief Complaint  Patient presents with  . Chest Pain  . Jaundice    HPI Seth Obrien is a 31 y.o. male with a PMHx of eczema and GERD, who presents to the ED accompanied by his wife [place] who is an NP in the pediatric ER here at [facility], with complaints of gradually worsening waxing/waning BLE swelling x3-4 weeks which suddenly worsened 3 days ago and started spreading to his abdomen/scrotum/thighs, with associated fatigue, night sweats, body aches, n/v/d for ~1 week, and sudden onset hiccups, chills, jaundice, bruising, tongue ulcers, upper chest and epigastric pain, SOB, and dark yellow urine x2-3 days. He has had ~5-6 episodes daily of NBNB emesis, and ~3 episodes daily of nonbloody diarrhea for the last 1 week. States he hasn't been able to keep much down, and hasn't been eating much at all. Tried tums on the way here to help with his epigastric pain, which he thought was just heartburn. No known aggravating factors. He denies any recent illness over the last several weeks. His only recent travel was to [place] in April, otherwise he has not traveled outside of [place]. He admits that he drinks about 1-3 drinks per day every day, but this is cut back from prior use. He and his wife initially contributed the fatigue and LE swelling to being on his feet a lot at work, and then thought perhaps there was some depression because of being laid off of work. It wasn't until the last several days when he started getting suddenly worse that they started becoming more concerned. He states his SOB was mainly at night for the last 2-3 nights, and then suddenly worsened today while he was driving to the hospital. +Smoker. No known FHx of cardiac disease. Has never had a colonoscopy, no family hx of pancreatic cancer; no family hx of liver problems, but grandparents had "some kidney  problems". +FHx of breast cancer in [place], no other cancers in the family. Pt is otherwise healthy aside from the GERD. He has not yet established care with a provider at [facility] family care, but he had a visit scheduled for Friday with Doran Durand.   He denies fevers, cough, URI symptoms, wheezing, hematochezia, melena, hematemesis, constipation, hematuria, dysuria, numbness, tingling, focal weakness, recent travel/surgery/immobilization, personal/family hx of DVT/PE, or any other complaints at this time.    The history is provided by the patient, medical records and the spouse. No language interpreter was used.    Past Medical History:  Diagnosis Date  . Eczema   . Heartburn     There are no active problems to display for this patient.   History reviewed. No pertinent surgical history.     Home Medications    Prior to Admission medications   Not on File    Family History No family history on file.  Social History Social History  Substance Use Topics  . Smoking status: Current Every Day Smoker    Packs/day: 1.00  . Smokeless tobacco: Never Used  . Alcohol use Yes     Comment: 3-4 drinks a day     Allergies   Patient has no allergy information on record.   Review of Systems Review of Systems  Constitutional: Positive for chills and fatigue. Negative for fever.       +night sweats  HENT:       +  tongue ulcers  Respiratory: Positive for shortness of breath. Negative for cough and wheezing.   Cardiovascular: Positive for chest pain (upper chest) and leg swelling.  Gastrointestinal: Positive for abdominal distention, abdominal pain (epigastric), diarrhea, nausea and vomiting. Negative for blood in stool and constipation.       +hiccups  Genitourinary: Positive for scrotal swelling. Negative for dysuria and hematuria.       +dark urine  Musculoskeletal: Positive for myalgias. Negative for arthralgias.  Skin: Positive for color change.  Allergic/Immunologic:  Negative for immunocompromised state.  Neurological: Negative for weakness and numbness.  Psychiatric/Behavioral: Negative for confusion.   All other systems reviewed and are negative for acute change except as noted in the HPI.    Physical Exam Updated Vital Signs BP (!) 88/45 (BP Location: Right Arm)   Pulse 98   Temp 97.7 F (36.5 C) (Oral)   Resp (!) 24   Ht 5' 10"  (1.778 m)   Wt 68 kg (150 lb)   SpO2 96%   BMI 21.52 kg/m   Physical Exam  Constitutional: He is oriented to person, place, and time. He appears well-developed and well-nourished. He appears ill. He appears distressed.  Afebrile, tachypneic, appears anxious, hypotensive, acutely ill individual  HENT:  Head: Normocephalic and atraumatic.  Mouth/Throat: Mucous membranes are dry.  Very dry lips and mucous membranes Aphthous ulcers to left lateral tongue  Eyes: Conjunctivae and EOM are normal. Right eye exhibits no discharge. Left eye exhibits no discharge. Scleral icterus is present.  Scleral icterus  Neck: Normal range of motion. Neck supple.  Cardiovascular: Normal rate, regular rhythm, S1 normal, S2 normal and intact distal pulses.  Exam reveals gallop and S3. Exam reveals no friction rub.   No murmur heard. HR 80-90s, RRR, nl s1/s2, ?S3, no definite m/r/g, distal pulses intact and dopplerable, 4+ pedal edema   Pulmonary/Chest: Tachypnea noted. No respiratory distress. He has no decreased breath sounds. He has no wheezes. He has no rhonchi. He has rales.  Tachypneic with RR 25-30, although not in acute respiratory distress. Faint bibasilar rales, no wheezing/rhonchi, no hypoxia, but speaking in fragmented sentences, SpO2 92-96% on RA   Abdominal: Soft. Normal appearance and bowel sounds are normal. He exhibits distension. There is hepatomegaly. There is no tenderness. There is no rigidity, no rebound, no guarding, no CVA tenderness, no tenderness at McBurney's point and negative Murphy's sign.  Soft, mildly  distended appearing, +BS throughout, with liver margin palpable ~8cm below costal margin, no abdominal TTP, no r/g/r, neg murphy's, neg mcburney's, no CVA TTP   Musculoskeletal: Normal range of motion.  MAE x4 Strength and sensation grossly intact in all extremities Distal pulses intact 4+ bilateral pedal edema up to his thighs  Neurological: He is alert and oriented to person, place, and time. He has normal strength. No sensory deficit.  Skin: Skin is warm, dry and intact. Bruising noted. No rash noted.  Very jaundiced Large bruising to R deltoid area, scattered bruising elsewhere  Psychiatric: He has a normal mood and affect.  Nursing note and vitals reviewed.    ED Treatments / Results  Labs (all labs ordered are listed, but only abnormal results are displayed) Labs Reviewed  CBC WITH DIFFERENTIAL/PLATELET - Abnormal; Notable for the following:       Result Value   WBC 15.5 (*)    RBC 3.56 (*)    Hemoglobin 12.2 (*)    HCT 34.9 (*)    MCH 34.3 (*)    Neutro Abs  12.8 (*)    Monocytes Absolute 1.5 (*)    All other components within normal limits  PROTIME-INR - Abnormal; Notable for the following:    Prothrombin Time 26.1 (*)    All other components within normal limits  APTT - Abnormal; Notable for the following:    aPTT 40 (*)    All other components within normal limits  BRAIN NATRIURETIC PEPTIDE - Abnormal; Notable for the following:    B Natriuretic Peptide 3,252.8 (*)    All other components within normal limits  BLOOD GAS, ARTERIAL - Abnormal; Notable for the following:    pO2, Arterial 275 (*)    Bicarbonate 5.6 (*)    Acid-base deficit 19.3 (*)    All other components within normal limits  I-STAT CG4 LACTIC ACID, ED - Abnormal; Notable for the following:    Lactic Acid, Venous >17.00 (*)    All other components within normal limits  I-STAT TROPONIN, ED - Abnormal; Notable for the following:    Troponin i, poc 0.27 (*)    All other components within normal  limits  CULTURE, BLOOD (ROUTINE X 2)  CULTURE, BLOOD (ROUTINE X 2)  COMPREHENSIVE METABOLIC PANEL  URINALYSIS, ROUTINE W REFLEX MICROSCOPIC  LIPASE, BLOOD  ETHANOL  ACETAMINOPHEN LEVEL  RAPID URINE DRUG SCREEN, HOSP PERFORMED  LACTATE DEHYDROGENASE  HEPATITIS PANEL, ACUTE  BILIRUBIN, DIRECT  DIC (DISSEMINATED INTRAVASCULAR COAGULATION) PANEL  I-STAT CHEM 8, ED  TYPE AND SCREEN  ABO/RH    EKG  EKG Interpretation  Date/Time:  Sunday August 19 2017 10:22:10 EDT Ventricular Rate:  98 PR Interval:    QRS Duration: 103 QT Interval:  354 QTC Calculation: 452 R Axis:   97 Text Interpretation:  Sinus rhythm Probable left atrial enlargement Borderline right axis deviation Minimal ST depression, diffuse leads No previous ECGs available Confirmed by Yao, David H ([id number]) on 08/19/2017 10:59:51 AM       Radiology Dg Chest Portable 1 View  Result Date: 08/19/2017 CLINICAL DATA:  Patient with recent onset jaundice. EXAM: PORTABLE CHEST 1 VIEW COMPARISON:  None. FINDINGS: Monitoring leads overlie the patient. Cardiac contours upper limits of normal. No consolidative pulmonary opacities. No pleural effusion or pneumothorax. IMPRESSION: Mild cardiomegaly.  No acute cardiopulmonary process. Electronically Signed   By: Drew  Davis M.D.   On: 08/19/2017 10:54    Procedures Procedures (including critical care time)  CRITICAL CARE Performed by: Alcee Sipos   Total critical care time: 45 minutes  Critical care time was exclusive of separately billable procedures and treating other patients.  Critical care was necessary to treat or prevent imminent or life-threatening deterioration.  Critical care was time spent personally by me on the following activities: development of treatment plan with patient and/or surrogate as well as nursing, discussions with consultants, evaluation of patient's response to treatment, examination of patient, obtaining history from patient or surrogate,  ordering and performing treatments and interventions, ordering and review of laboratory studies, ordering and review of radiographic studies, pulse oximetry and re-evaluation of patient's condition.   Medications Ordered in ED Medications  ondansetron (ZOFRAN) injection 4 mg (4 mg Intravenous Given 08/19/17 1113)     Initial Impression / Assessment and Plan / ED Course  I have reviewed the triage vital signs and the nursing notes.  Pertinent labs & imaging results that were available during my care of the patient were reviewed by me and considered in my medical decision making (see chart for details).     30  y.o. male  here with a myriad of concerning symptoms: body swelling, body aches, fatigue, night sweats, upper chest/epigastric pain, n/v/d over the last 3-4 weeks, then 2-4 days ago started having SOB, worsening swelling, bruising, jaundice, tongue ulcers, dark urine. He does drink alcohol but has cut back quite a bit. On exam, very jaundiced, tachypneic, appears anxious, HR 80-90s, ?S3 heart sound, mild bibasilar rales, lips very dry, 4+ pitting edema in BLE extending up to his thighs, abd without tenderness but appears mildly distended and palpable liver margin approx 8cm below costal margin. Very odd constellation of acute onset symptoms in an otherwise healthy individual. Will get labs, cultures, ABG, APAP level, portable CXR, CT chest/abd/pelv if possible; initially wanted to give thorazine and lasix, but my attending Dr. Darl Householder would prefer to wait for labs to come back; will give zofran instead, and he prefers to place on bipap instead. Will hold off on calling code sepsis, although pt meets SIRS criteria, do NOT want to fluid overload pt and would prefer to avoid empiric abx until we get a better etiology of what's going on. Will monitor closely and reassess shortly.   11:46 AM Lactic >17, but still holding off on fluids for now. CBC w/diff with leukocytosis of 15.5, anemia with H/H  12.2/34.9. CMP and Lipase pending. Chem 8 with hyponatremia 109, hyperkalemia 5.3, BUN 51, Cr 5.8. APTT elevated at 40. INR elevated at 26.1. BNP 3252.8. Trop 0.27 likely from demand. EKG with ST depression diffusely. CXR with mild cardiomegaly. Pt didn't tolerate bipap, on nonrebreather and satting well. Will consult critical care. Added on DIC panel, direct bili, hepatitis panel, and awaiting the rest of the other results. Pt going to CT now.   11:57 AM Remainder of work up still pending/in process. Dr. Veleta Miners of critical care service returning page and will admit. Holding orders to be placed by admitting team. Please see their notes for further documentation of care. I appreciate their help with this pleasant pt's care. Pt stable at time of admission.    Final Clinical Impressions(s) / ED Diagnoses   Final diagnoses:  Acute respiratory distress  Hypotension, unspecified hypotension type  Anemia, unspecified type  Jaundice  Elevated troponin  Elevated lactic acid level  Metabolic acidosis  Nausea vomiting and diarrhea  Hyponatremia  Hyperkalemia    New Prescriptions New Prescriptions   No medications on [address], [place], [place] 08/19/17 1225    Drenda Freeze, MD 08/19/17 1640

## 2017-08-19 NOTE — Progress Notes (Signed)
Wasted 75 mcg of fentanyl in the sink with Melinda. Huntley EstelleLewis, Jerni Selmer E, RN 08/19/2017 5:25 PM

## 2017-08-19 NOTE — Consult Note (Signed)
CARDIOLOGY CONSULT NOTE     Primary Care Physician: Patient, No Pcp Per Referring Physician:  Dr Levora AngelBrahmbhatt  Admit Date: 08/19/2017  Reason for consultation:  Chest pain  Seth Obrien is a 31 y.o. male without significant PMh who now presents with profound metabolic derangements including lactic acidosis, acute liver failure, and acute renal failure of unknown cause.  He has noticed increasing edema for 4-6 weeks.  He then noticed icteral sclelra 2 days ago.  He has had some chest discomfort intermittently today.  He is presently very anxious and unable to provide additional history related to his chest pain.  He has per report had chest pain which is sharp with radation towards his neck with assocaited nausea, vomiting, and loose stools. [facility]  Pt is currently hypotensive, anxious, and unable to provide additional history.  Past Medical History:  Diagnosis Date  . Eczema   . Heartburn    History reviewed. No pertinent surgical history.  . multivitamins with iron  1 tablet Oral Daily  . pantoprazole (PROTONIX) IV  40 mg Intravenous QHS  . sodium bicarbonate      . sodium bicarbonate      . sodium bicarbonate  100 mEq Intravenous Once  . sodium polystyrene  30 g Rectal Once  . thiamine injection  100 mg Intravenous Daily   . sodium chloride    . dextrose 5 % 800 mL with acetylcysteine (ACETADOTE) 40,000 mg infusion     Followed by  . dextrose 5 % 800 mL with acetylcysteine (ACETADOTE) 40,000 mg infusion    . phenylephrine (NEO-SYNEPHRINE) Adult infusion 300 mcg/min (08/19/17 1720)  .  sodium bicarbonate (isotonic) infusion in sterile water 50 mL/hr at 08/19/17 1600    No Known Allergies  Social History   Social History  . Marital status: Married    Spouse name: N/A  . Number of children: N/A  . Years of education: N/A   Occupational History  . Not on file.   Social History Main Topics  . Smoking status:  Current Every Day Smoker    Packs/day: 1.00  . Smokeless tobacco: Never Used  . Alcohol use Yes     Comment: 3-4 drinks a day  . Drug use: No     Comment: suboxone  . Sexual activity: Not on file   Other Topics Concern  . Not on file   Social History Narrative  . No narrative on file    FH- unable to obtain  ROS- unable to obtain  Physical Exam: Telemetry: Vitals:   08/19/17 1634 08/19/17 1645 08/19/17 1700 08/19/17 1715  BP:  (!) 75/35 (!) 79/30 (!) 79/47  Pulse:  100 93 94  Resp:  (!) 27 (!) 21 (!) 24  Temp: 98 F (36.7 C)     TempSrc: Oral     SpO2:  100% 100% 100%  Weight:      Height:        GEN- The patient is very ill, near extremis appearing, alert and very anxious   Head- normocephalic, atraumatic Eyes-  Sclera icteric Ears- hearing intact Oropharynx- clear Neck- supple, no JVP Lungs- Clear to ausculation bilaterally, normal work of breathing Heart- Regular rate and rhythm, no murmurs, rubs or gallops, PMI not laterally displaced GI- soft, NT, ND, + BS Extremities- no clubbing, cyanosis, +2 edema MS- no significant deformity or atrophy Skin- no rash or lesion Psych- very anxious appearing Neuro- he states  that he cannot move "anything but my eyes" but seems to be moving head, neck, and extremities without difficulty  EKG: reveals sinus rhythm, no ischemic changes  Labs:   Lab Results  Component Value Date   WBC 15.5 (H) 08/19/2017   HGB 12.2 (L) 08/19/2017   HCT 34.9 (L) 08/19/2017   MCV 98.0 08/19/2017   PLT 317 08/19/2017    Recent Labs Lab 08/19/17 1030 08/19/17 1421  NA 113* 112*  K 5.7* 6.3*  CL 69* 68*  CO2 <7* 8*  BUN 59* 62*  CREATININE 5.89* 5.90*  CALCIUM 9.9 9.6  PROT 6.7  --   BILITOT 8.8*  --   ALKPHOS 60  --   ALT <5*  --   AST <5*  --   GLUCOSE 107* 122*   No results found for: CKTOTAL, CKMB, CKMBINDEX, TROPONINI No results found for: CHOL No results found for: HDL No results found for: LDLCALC No results found  for: TRIG No results found for: CHOLHDL No results found for: LDLDIRECT    Radiology: reviewed  Echo: pending  ASSESSMENT AND PLAN:   1. Acute and profound metabolic derangements including hyponatremia, lactic acidosis, liver failure, and renal failure Unclear etiology Unlikely cardiac in nature PCCM and GI evaluating Nephrology consult pending  2. Chest pain  Appears atypical currently No ischemic ekg changes and troponin is suprisingly near normal given all of his other metabolic abnormalities. Too sick for invasive CV workup Will order echo  Cardiology to follow  Very ill gentleman with complex medical condition.  Critial care time was spent personally by me (independant of midlevel providers) on the following activities: development of treatment plan with patient and significant other as well as nursing, evaluation of patients response to treatment, examining patient, obtaining history from patient or surrogate, ordering/ reviewing treatments/ interventions, lab studies, radiographic studies, pulse ox, and re-evaluation of patients condition.     The patient is critically ill with multiple organ systems failure and requires high complexity decision making for assessment and support, frequent evaluation and titration of therapies, application of advanced monitoring technologies and extensive interpretation of databases.   Critical care was necessary to treat or prevent immintent or life-threatening deterioration.  Total CCT spent directly with the patient today is 20 minutes  Hillis Range MD, [facility] 08/19/2017 5:53 PM

## 2017-08-19 NOTE — ED Notes (Signed)
Lab called critical lab values sodium 113 and co2 less then 7. Reported to provider.

## 2017-08-19 NOTE — Progress Notes (Signed)
eLink Physician-Brief Progress Note Patient Name: Dara HoyerCody Choudhry DOB: 04/05/1986 MRN: [id number]   Date of Service  08/19/2017  HPI/Events of Note  Multiple issues: 1. K+ = 6.3, Creatinine = 390. 2. HCO3 = 8 and lactic acid > 17.0. 3. BP = 98/41.   eICU Interventions  Will order: 1. Please check blood glucose STAT. 2. Kayexalate 30 gm per rectum now. 3. Repeat BMP at 9 PM. 4. ABG STAT.      Intervention Category Major Interventions: Acid-Base disturbance - evaluation and management;Electrolyte abnormality - evaluation and management  Sommer,Steven Eugene 08/19/2017, 4:15 PM

## 2017-08-20 ENCOUNTER — Inpatient Hospital Stay (HOSPITAL_COMMUNITY): Payer: PRIVATE HEALTH INSURANCE

## 2017-08-20 ENCOUNTER — Encounter (HOSPITAL_COMMUNITY): Payer: Self-pay | Admitting: *Deleted

## 2017-08-20 DIAGNOSIS — I340 Nonrheumatic mitral (valve) insufficiency: Secondary | ICD-10-CM

## 2017-08-20 DIAGNOSIS — R7989 Other specified abnormal findings of blood chemistry: Secondary | ICD-10-CM

## 2017-08-20 DIAGNOSIS — E872 Acidosis, unspecified: Secondary | ICD-10-CM

## 2017-08-20 DIAGNOSIS — K7200 Acute and subacute hepatic failure without coma: Secondary | ICD-10-CM

## 2017-08-20 DIAGNOSIS — R0789 Other chest pain: Secondary | ICD-10-CM

## 2017-08-20 DIAGNOSIS — N179 Acute kidney failure, unspecified: Principal | ICD-10-CM

## 2017-08-20 LAB — HEPATITIS PANEL, ACUTE
HEP A IGM: NEGATIVE
HEP B S AG: NEGATIVE
Hep B C IgM: NEGATIVE

## 2017-08-20 LAB — RAPID URINE DRUG SCREEN, HOSP PERFORMED
Amphetamines: NOT DETECTED
BARBITURATES: NOT DETECTED
Benzodiazepines: NOT DETECTED
Cocaine: NOT DETECTED
Opiates: POSITIVE — AB
TETRAHYDROCANNABINOL: NOT DETECTED

## 2017-08-20 LAB — CBC
HEMATOCRIT: 34.7 % — AB (ref 39.0–52.0)
HEMOGLOBIN: 12.3 g/dL — AB (ref 13.0–17.0)
MCH: 33.9 pg (ref 26.0–34.0)
MCHC: 35.4 g/dL (ref 30.0–36.0)
MCV: 95.6 fL (ref 78.0–100.0)
Platelets: 258 10*3/uL (ref 150–400)
RBC: 3.63 MIL/uL — AB (ref 4.22–5.81)
RDW: 13.8 % (ref 11.5–15.5)
WBC: 19.3 10*3/uL — ABNORMAL HIGH (ref 4.0–10.5)

## 2017-08-20 LAB — PROTIME-INR
INR: 2.24
Prothrombin Time: 24.6 seconds — ABNORMAL HIGH (ref 11.4–15.2)

## 2017-08-20 LAB — BLOOD GAS, ARTERIAL
ACID-BASE EXCESS: 2.3 mmol/L — AB (ref 0.0–2.0)
Bicarbonate: 24.9 mmol/L (ref 20.0–28.0)
Drawn by: 28340
FIO2: 21
O2 SAT: 97.7 %
PCO2 ART: 29.3 mmHg — AB (ref 32.0–48.0)
PO2 ART: 100 mmHg (ref 83.0–108.0)
Patient temperature: 98.6
pH, Arterial: 7.539 — ABNORMAL HIGH (ref 7.350–7.450)

## 2017-08-20 LAB — URINALYSIS, ROUTINE W REFLEX MICROSCOPIC
Bilirubin Urine: NEGATIVE
Glucose, UA: 50 mg/dL — AB
Ketones, ur: 20 mg/dL — AB
LEUKOCYTES UA: NEGATIVE
NITRITE: NEGATIVE
Protein, ur: 100 mg/dL — AB
SPECIFIC GRAVITY, URINE: 1.016 (ref 1.005–1.030)
pH: 5 (ref 5.0–8.0)

## 2017-08-20 LAB — CK: CK TOTAL: 477 U/L — AB (ref 49–397)

## 2017-08-20 LAB — RENAL FUNCTION PANEL
ALBUMIN: 2.6 g/dL — AB (ref 3.5–5.0)
ALBUMIN: 2.5 g/dL — AB (ref 3.5–5.0)
ANION GAP: 26 — AB (ref 5–15)
Anion gap: 23 — ABNORMAL HIGH (ref 5–15)
BUN: 76 mg/dL — ABNORMAL HIGH (ref 6–20)
BUN: 74 mg/dL — AB (ref 6–20)
CALCIUM: 8.1 mg/dL — AB (ref 8.9–10.3)
CALCIUM: 7.8 mg/dL — AB (ref 8.9–10.3)
CHLORIDE: 74 mmol/L — AB (ref 101–111)
CO2: 22 mmol/L (ref 22–32)
CO2: 24 mmol/L (ref 22–32)
CREATININE: 6.05 mg/dL — AB (ref 0.61–1.24)
Chloride: 73 mmol/L — ABNORMAL LOW (ref 101–111)
Creatinine, Ser: 6.2 mg/dL — ABNORMAL HIGH (ref 0.61–1.24)
GFR calc non Af Amer: 11 mL/min — ABNORMAL LOW (ref >60)
GFR, EST AFRICAN AMERICAN: 13 mL/min — AB (ref >60)
GFR, EST AFRICAN AMERICAN: 13 mL/min — AB (ref >60)
GFR, EST NON AFRICAN AMERICAN: 11 mL/min — AB (ref >60)
Glucose, Bld: 95 mg/dL (ref 65–99)
Glucose, Bld: 107 mg/dL — ABNORMAL HIGH (ref 65–99)
PHOSPHORUS: 6.4 mg/dL — AB (ref 2.5–4.6)
PHOSPHORUS: 6.3 mg/dL — AB (ref 2.5–4.6)
POTASSIUM: 4.2 mmol/L (ref 3.5–5.1)
Potassium: 4.1 mmol/L (ref 3.5–5.1)
SODIUM: 121 mmol/L — AB (ref 135–145)
Sodium: 121 mmol/L — ABNORMAL LOW (ref 135–145)

## 2017-08-20 LAB — GLUCOSE, CAPILLARY
GLUCOSE-CAPILLARY: 107 mg/dL — AB (ref 65–99)
GLUCOSE-CAPILLARY: 112 mg/dL — AB (ref 65–99)
GLUCOSE-CAPILLARY: 125 mg/dL — AB (ref 65–99)
GLUCOSE-CAPILLARY: 134 mg/dL — AB (ref 65–99)
Glucose-Capillary: 118 mg/dL — ABNORMAL HIGH (ref 65–99)
Glucose-Capillary: 97 mg/dL (ref 65–99)

## 2017-08-20 LAB — OSMOLALITY, URINE: Osmolality, Ur: 299 mOsm/kg — ABNORMAL LOW (ref 300–900)

## 2017-08-20 LAB — COMPREHENSIVE METABOLIC PANEL
ALBUMIN: 2.8 g/dL — AB (ref 3.5–5.0)
ALK PHOS: 49 U/L (ref 38–126)
ALT: 107 U/L — AB (ref 17–63)
ANION GAP: 24 — AB (ref 5–15)
AST: 302 U/L — ABNORMAL HIGH (ref 15–41)
BILIRUBIN TOTAL: 7.6 mg/dL — AB (ref 0.3–1.2)
BUN: 70 mg/dL — ABNORMAL HIGH (ref 6–20)
CALCIUM: 8.8 mg/dL — AB (ref 8.9–10.3)
CO2: 24 mmol/L (ref 22–32)
CREATININE: 6.03 mg/dL — AB (ref 0.61–1.24)
Chloride: 72 mmol/L — ABNORMAL LOW (ref 101–111)
GFR calc Af Amer: 13 mL/min — ABNORMAL LOW (ref >60)
GFR calc non Af Amer: 11 mL/min — ABNORMAL LOW (ref >60)
GLUCOSE: 110 mg/dL — AB (ref 65–99)
Potassium: 4.5 mmol/L (ref 3.5–5.1)
Sodium: 120 mmol/L — ABNORMAL LOW (ref 135–145)
TOTAL PROTEIN: 5.8 g/dL — AB (ref 6.5–8.1)

## 2017-08-20 LAB — POCT ACTIVATED CLOTTING TIME
ACTIVATED CLOTTING TIME: 153 s
Activated Clotting Time: 153 seconds

## 2017-08-20 LAB — LACTIC ACID, PLASMA
LACTIC ACID, VENOUS: 2.6 mmol/L — AB (ref 0.5–1.9)
Lactic Acid, Venous: 2.1 mmol/L (ref 0.5–1.9)

## 2017-08-20 LAB — TROPONIN I
TROPONIN I: 14.07 ng/mL — AB (ref <0.03)
Troponin I: 19.34 ng/mL (ref <0.03)
Troponin I: 19.74 ng/mL (ref <0.03)

## 2017-08-20 LAB — ECHOCARDIOGRAM COMPLETE
Height: 70 in
WEIGHTICAEL: 2400 [oz_av]

## 2017-08-20 LAB — HIV ANTIBODY (ROUTINE TESTING W REFLEX): HIV Screen 4th Generation wRfx: NONREACTIVE

## 2017-08-20 LAB — PHOSPHORUS: Phosphorus: 6 mg/dL — ABNORMAL HIGH (ref 2.5–4.6)

## 2017-08-20 LAB — PROTEIN / CREATININE RATIO, URINE
CREATININE, URINE: 116.62 mg/dL
Total Protein, Urine: <6 mg/dL

## 2017-08-20 LAB — SODIUM: Sodium: 121 mmol/L — ABNORMAL LOW (ref 135–145)

## 2017-08-20 LAB — MAGNESIUM: MAGNESIUM: 1.7 mg/dL (ref 1.7–2.4)

## 2017-08-20 MED ORDER — SODIUM CHLORIDE 0.9% FLUSH
10.0000 mL | Freq: Two times a day (BID) | INTRAVENOUS | Status: DC
  Administered 2017-08-20: 10 mL
  Administered 2017-08-21: 20 mL
  Administered 2017-08-22 (×2): 10 mL

## 2017-08-20 MED ORDER — HEPARIN 1000 UNIT/ML FOR PERITONEAL DIALYSIS
3000.0000 [IU] | INTRAMUSCULAR | Status: DC | PRN
  Administered 2017-08-20: 2.4 [IU] via INTRAPERITONEAL
  Filled 2017-08-20 (×4): qty 3

## 2017-08-20 MED ORDER — HEPARIN SODIUM (PORCINE) 1000 UNIT/ML DIALYSIS
1000.0000 [IU] | INTRAMUSCULAR | Status: DC | PRN
  Administered 2017-08-22: 2800 [IU] via INTRAVENOUS_CENTRAL
  Filled 2017-08-20: qty 6
  Filled 2017-08-20: qty 3

## 2017-08-20 MED ORDER — CHLORHEXIDINE GLUCONATE CLOTH 2 % EX PADS
6.0000 | MEDICATED_PAD | Freq: Every day | CUTANEOUS | Status: DC
  Administered 2017-08-21 – 2017-08-22 (×2): 6 via TOPICAL

## 2017-08-20 MED ORDER — PRISMASOL BGK 4/2.5 32-4-2.5 MEQ/L IV SOLN
INTRAVENOUS | Status: DC
  Administered 2017-08-20 – 2017-08-21 (×2): via INTRAVENOUS_CENTRAL
  Filled 2017-08-20 (×3): qty 5000

## 2017-08-20 MED ORDER — SODIUM CHLORIDE 0.9% FLUSH: 10.0000 mL | INTRAVENOUS | Status: DC | PRN

## 2017-08-20 MED ORDER — SODIUM CHLORIDE 0.9 % IV SOLN
Freq: Once | INTRAVENOUS | Status: AC
  Administered 2017-08-20: 11:00:00 via INTRAVENOUS

## 2017-08-20 MED ORDER — PRISMASOL BGK 4/2.5 32-4-2.5 MEQ/L IV SOLN
INTRAVENOUS | Status: DC
  Administered 2017-08-20 – 2017-08-22 (×3): via INTRAVENOUS_CENTRAL
  Filled 2017-08-20 (×5): qty 5000

## 2017-08-20 MED ORDER — BUPRENORPHINE HCL-NALOXONE HCL 8-2 MG SL SUBL
1.0000 | SUBLINGUAL_TABLET | Freq: Two times a day (BID) | SUBLINGUAL | Status: DC
  Administered 2017-08-20 – 2017-08-26 (×13): 1 via SUBLINGUAL
  Filled 2017-08-20 (×13): qty 1

## 2017-08-20 MED ORDER — FUROSEMIDE 10 MG/ML IJ SOLN
80.0000 mg | Freq: Once | INTRAMUSCULAR | Status: AC
  Administered 2017-08-20: 80 mg via INTRAVENOUS
  Filled 2017-08-20: qty 8

## 2017-08-20 MED ORDER — FUROSEMIDE 40 MG PO TABS: 40.0000 mg | ORAL_TABLET | Freq: Once | ORAL | Status: DC

## 2017-08-20 MED ORDER — SODIUM CHLORIDE 0.9 % FOR CRRT: INTRAVENOUS_CENTRAL | Status: DC | PRN

## 2017-08-20 MED ORDER — PRISMASOL BGK 4/2.5 32-4-2.5 MEQ/L IV SOLN
INTRAVENOUS | Status: DC
  Administered 2017-08-20 – 2017-08-22 (×6): via INTRAVENOUS_CENTRAL
  Filled 2017-08-20 (×10): qty 5000

## 2017-08-20 NOTE — Procedures (Signed)
Patient seen and examined on CRRT.  Started at around 1700.  QB 200 mL/ min, UF goal 10-50 mL/ hr.  Close monitoring of serum sodium.  Treatment adjusted as needed.  Bufford ButtnerElizabeth Manny Vitolo MD [facility] pgr [phone] 5:43 PM

## 2017-08-20 NOTE — Procedures (Signed)
Central Venous Catheter Insertion Procedure Note Dara HoyerCody Segovia [id number] 04/10/1986  Procedure: Insertion of Central Venous dialysis Catheter Indications: CRRT  Procedure Details Consent: Risks of procedure as well as the alternatives and risks of each were explained to the (patient/caregiver).  Consent for procedure obtained. Time Out: Verified patient identification, verified procedure, site/side was marked, verified correct patient position, special equipment/implants available, medications/allergies/relevent history reviewed, required imaging and test results available.  Performed Real time US was used to ID and cannulate vessel  Maximum sterile technique was used including antiseptics, cap, gloves, gown, hand hygiene, mask and sheet. Skin prep: Chlorhexidine; local anesthetic administered A antimicrobial bonded/coated triple lumen catheter was placed in the right internal jugular vein using the Seldinger technique.  Evaluation Blood flow good Complications: No apparent complications Patient did tolerate procedure well. Chest X-ray ordered to verify placement.  CXR: pending.  Shelby Mattocksete E Indria Bishara 08/20/2017, 1:36 PM  Simonne MartinetPeter E Isaura Schiller ACNP-BC [facility] Pulmonary/Critical Care Pager # [phone] OR # [phone] if no answer

## 2017-08-20 NOTE — Progress Notes (Signed)
Initial Nutrition Assessment  DOCUMENTATION CODES:   Not applicable  INTERVENTION:   RD will order chocolate Ensure when diet advanced; if pt unable to tolerate oral diet, recommend post-pyloric Cortrak tube.  Recommend check thiamine, O-75, and folic acid levels  MVI  Thiamine  NUTRITION DIAGNOSIS:   Inadequate oral intake related to acute illness, acute liver failure as evidenced by energy intake < or equal to 50% for > or equal to 5 days, per patient/family report.  GOAL:   Patient will meet greater than or equal to 90% of their needs  MONITOR:   Diet advancement, Labs, Weight trends, I & O's  REASON FOR ASSESSMENT:   Malnutrition Screening Tool    ASSESSMENT:   31 y.o. male here with a myriad of concerning symptoms: body swelling, body aches, fatigue, night sweats, upper chest/epigastric pain, n/v/d over the last 3-4 weeks, then 2-4 days ago started having SOB, worsening swelling, bruising, jaundice, tongue ulcers, dark urine. He does drink alcohol but has cut back quite a bit.   Met with pt and pt's wife in room today. Pt reports that he developed leg swelling about two weeks ago. Pt later developed jaundice, fatigue, nausea, vomiting, and diarrhea and has now not eaten in about 8 days. Pt reports that he has been able to keep liquids down but no solid food. Pt does drink alcohol daily and reports that he has been on PPIs since he was 31 years old for chronic acid reflux. Pt denies any tingling in his fingers or toes or any hair loss/changes in the texture of his hair. Pt does report a swollen, sore tongue. Pt reports some weight loss but is unable to tell me how much and reports that his weight is currently up r/t edema. Pt reports that he is starving today and he would like to eat. RD suspects pt is with severe acute malnutrition r/t pt eating <50% estimated needs for > 1 week and pt with etoh abuse and on chronic PPIs. Spoke to MD, recommended check folic acid, thiamine,  and B-12 labs and initiate folic acid and I-43 supplements. Pt already ordered for MVI. Recommend initiate oral diet when appropriate; if pt unable to tolerate oral diet recommend post-ploric Cortrak tube. Pt would like to have chocolate Ensure when diet advanced. Hyponatremia improving. Pt with elevated Phosphorus. Per MD, will initiate on Lasix today and possibly start CRRT if no improvement. Pt producing very little urine.          Medications reviewed and include: suboxone, MVI, thiamine, neo-synephrine, fentanyl, zofran prn  Labs reviewed: Na 120(L), K 4.5 wnl, Cl 72(L), BUN 70(H), creat 6.03(H), Ca 8.8(L), P 6.0(H), Mg 1.7 wnl, alb 2.8(L), AST 302(H), ALT 107(H), tbili 7.6(H) Lactic acid- 17.0(H) Wbc- 15.5(H)  Nutrition-Focused physical exam completed. Findings are no fat depletion, no muscle depletion, and mild generalized edema.   Diet Order:  Diet NPO time specified Except for: Sips with Meds  Skin:  Reviewed, no issues  Last BM:  9/9  Height:   Ht Readings from Last 1 Encounters:  08/19/17 _0  (1.778 m)    Weight:   Wt Readings from Last 1 Encounters:  08/19/17 150 lb (68 kg)    Ideal Body Weight:  75.4 kg  BMI:  Body mass index is 21.52 kg/m.  Estimated Nutritional Needs:   Kcal:  1850-2150kcal/day   Protein:  68-82g/day (1.0-1.2g/kg r/t severe AKI)  Fluid:  per MD  EDUCATION NEEDS:   Education needs addressed  Myriam Jacobson  Megan Salon MS, RD, LDN Pager #- [phone] After Hours Pager: [phone]

## 2017-08-20 NOTE — Consult Note (Signed)
[facility] Consult Note     Date: 08/20/2017                  Patient Name:  Seth Obrien  MRN: [id number]  DOB: 1986-05-28  Age / Sex: 31 y.o., male         PCP: Patient, No Pcp Per                 Service Requesting Consult: PCCM- Dr. Vaughan Browner                 Reason for Consult: Acute kidney injury and metabolic acidosis            Chief Complaint: abd distention, jaudice, SOB  HPI: Pt is a 3M with a PMH significant for chronic EtOH use, h/o opioid addiction on Suboxone, and h/o reflux who is now seen in consultation at the request of Dr. Vaughan Browner for acute kidney injury.    Pt was in his usual state of health until approximately 2 weeks ago when he noted his legs were swelling.  Got better without intervention and he thought it was due to standing up at work all day.    However, over the weekend his wife noted that his pants were fitting much tighter and he was swelling again in his LEs.  His eyes were becoming yellowed and he was having trouble walking distances that were previously easy for him.  In this setting, his wife took him to ED.  He was found to have AKI and acute liver injury with multiple severe metabolic derangements including hyponatremia, hyperkalemia, metabolic acidosis, and a lactic acidosis of > 17.  He noted he ate some mushrooms from his yard about a month ago.  Does not report any other ingestions.  Drinks several ciders a day.  He reports feeling anxious.   Past Medical History:  Diagnosis Date  . Eczema   . Heartburn     History reviewed. No pertinent surgical history.  No family history on file. Social History:  reports that he has been smoking.  He has been smoking about 1.00 pack per day. He has never used smokeless tobacco. He reports that he drinks alcohol. He reports that he does not use drugs.  Allergies: No Known Allergies  Medications Prior to Admission  Medication Sig Dispense Refill  . acetaminophen (TYLENOL) 500 MG  tablet Take 1,000 mg by mouth every 6 (six) hours as needed for moderate pain or headache.    . Buprenorphine HCl-Naloxone HCl (SUBOXONE) 8-2 MG FILM Place 1 Film under the tongue 2 (two) times daily.    Marland Kitchen omeprazole (PRILOSEC OTC) 20 MG tablet Take 20 mg by mouth daily as needed (for acid reflux).      Results for orders placed or performed during the hospital encounter of 08/19/17 (from the past 48 hour(s))  Comprehensive metabolic panel     Status: Abnormal   Collection Time: 08/19/17 10:30 AM  Result Value Ref Range   Sodium 113 (LL) 135 - 145 mmol/L    Comment: CRITICAL RESULT CALLED TO, READ BACK BY AND VERIFIED WITH: NICKOLICHGRN [id number] MCCAULEG REPEATED TO VERIFY    Potassium 5.7 (H) 3.5 - 5.1 mmol/L   Chloride 69 (L) 101 - 111 mmol/L   CO2 <7 (L) 22 - 32 mmol/L    Comment: REPEATED TO VERIFY   Glucose, Bld 107 (H) 65 - 99 mg/dL   BUN 59 (H) 6 - 20 mg/dL   Creatinine, Ser 5.89 (  H) 0.61 - 1.24 mg/dL   Calcium 9.9 8.9 - 10.3 mg/dL   Total Protein 6.7 6.5 - 8.1 g/dL   Albumin 3.4 (L) 3.5 - 5.0 g/dL   AST <5 (L) 15 - 41 U/L    Comment: REPEATED TO VERIFY   ALT <5 (L) 17 - 63 U/L    Comment: REPEATED TO VERIFY   Alkaline Phosphatase 60 38 - 126 U/L   Total Bilirubin 8.8 (H) 0.3 - 1.2 mg/dL   GFR calc non Af Amer 12 (L) >60 mL/min   GFR calc Af Amer 13 (L) >60 mL/min    Comment: (NOTE) The eGFR has been calculated using the CKD EPI equation. This calculation has not been validated in all clinical situations. eGFR's persistently <60 mL/min signify possible Chronic Kidney Disease.   CBC with Differential     Status: Abnormal   Collection Time: 08/19/17 10:30 AM  Result Value Ref Range   WBC 15.5 (H) 4.0 - 10.5 K/uL   RBC 3.56 (L) 4.22 - 5.81 MIL/uL   Hemoglobin 12.2 (L) 13.0 - 17.0 g/dL   HCT 34.9 (L) 39.0 - 52.0 %   MCV 98.0 78.0 - 100.0 fL   MCH 34.3 (H) 26.0 - 34.0 pg   MCHC 35.0 30.0 - 36.0 g/dL   RDW 13.4 11.5 - 15.5 %   Platelets 348 150 - 400 K/uL    Neutrophils Relative % 83 %   Neutro Abs 12.8 (H) 1.7 - 7.7 K/uL   Lymphocytes Relative 8 %   Lymphs Abs 1.3 0.7 - 4.0 K/uL   Monocytes Relative 9 %   Monocytes Absolute 1.5 (H) 0.1 - 1.0 K/uL   Eosinophils Relative 0 %   Eosinophils Absolute 0.0 0.0 - 0.7 K/uL   Basophils Relative 0 %   Basophils Absolute 0.0 0.0 - 0.1 K/uL  Protime-INR     Status: Abnormal   Collection Time: 08/19/17 10:30 AM  Result Value Ref Range   Prothrombin Time 26.1 (H) 11.4 - 15.2 seconds   INR 2.42   Lipase, blood     Status: Abnormal   Collection Time: 08/19/17 10:30 AM  Result Value Ref Range   Lipase 84 (H) 11 - 51 U/L  APTT     Status: Abnormal   Collection Time: 08/19/17 10:30 AM  Result Value Ref Range   aPTT 40 (H) 24 - 36 seconds    Comment:        IF BASELINE aPTT IS ELEVATED, SUGGEST PATIENT RISK ASSESSMENT BE USED TO DETERMINE APPROPRIATE ANTICOAGULANT THERAPY.   Brain natriuretic peptide     Status: Abnormal   Collection Time: 08/19/17 10:30 AM  Result Value Ref Range   B Natriuretic Peptide 3,252.8 (H) 0.0 - 100.0 pg/mL  Ethanol     Status: None   Collection Time: 08/19/17 10:30 AM  Result Value Ref Range   Alcohol, Ethyl (B) <5 <5 mg/dL    Comment:        LOWEST DETECTABLE LIMIT FOR SERUM ALCOHOL IS 5 mg/dL FOR MEDICAL PURPOSES ONLY   Acetaminophen level     Status: Abnormal   Collection Time: 08/19/17 10:30 AM  Result Value Ref Range   Acetaminophen (Tylenol), Serum <10 (L) 10 - 30 ug/mL    Comment:        THERAPEUTIC CONCENTRATIONS VARY SIGNIFICANTLY. A RANGE OF 10-30 ug/mL MAY BE AN EFFECTIVE CONCENTRATION FOR MANY PATIENTS. HOWEVER, SOME ARE BEST TREATED AT CONCENTRATIONS OUTSIDE THIS RANGE. ACETAMINOPHEN CONCENTRATIONS >150 ug/mL AT 4  HOURS AFTER INGESTION AND >50 ug/mL AT 12 HOURS AFTER INGESTION ARE OFTEN ASSOCIATED WITH TOXIC REACTIONS.   ABO/Rh     Status: None   Collection Time: 08/19/17 10:41 AM  Result Value Ref Range   ABO/RH(D) A POS   Type and  screen     Status: None   Collection Time: 08/19/17 10:46 AM  Result Value Ref Range   ABO/RH(D) A POS    Antibody Screen NEG    Sample Expiration 08/22/2017   I-stat troponin, ED     Status: Abnormal   Collection Time: 08/19/17 11:14 AM  Result Value Ref Range   Troponin i, poc 0.27 (HH) 0.00 - 0.08 ng/mL   Comment NOTIFIED PHYSICIAN    Comment 3            Comment: Due to the release kinetics of cTnI, a negative result within the first hours of the onset of symptoms does not rule out myocardial infarction with certainty. If myocardial infarction is still suspected, repeat the test at appropriate intervals.   I-Stat CG4 Lactic Acid, ED     Status: Abnormal   Collection Time: 08/19/17 11:16 AM  Result Value Ref Range   Lactic Acid, Venous >17.00 (HH) 0.5 - 1.9 mmol/L   Comment NOTIFIED PHYSICIAN   Blood gas, arterial     Status: Abnormal   Collection Time: 08/19/17 11:30 AM  Result Value Ref Range   FIO2 100.00    Delivery systems NON-REBREATHER OXYGEN MASK    pH, Arterial 7.410 7.350 - 7.450   pCO2 arterial  32.0 - 48.0 mmHg    CRITICAL RESULT CALLED TO, READ BACK BY AND VERIFIED WITH:    Comment: BELOW REPORTABLE RANGE, ASHLEY HOLCOMB RRT,RCP AT 1135 BY MORGAN MACON RRT,RCP ON 08/19/2017   pO2, Arterial 275 (H) 83.0 - 108.0 mmHg   Bicarbonate 5.6 (L) 20.0 - 28.0 mmol/L   Acid-base deficit 19.3 (H) 0.0 - 2.0 mmol/L   O2 Saturation 99.6 %   Patient temperature 97.4    Collection site BRACHIAL ARTERY    Drawn by [id number]    Sample type ARTERIAL DRAW   Lactate dehydrogenase     Status: Abnormal   Collection Time: 08/19/17 12:40 PM  Result Value Ref Range   LDH 422 (H) 98 - 192 U/L  Bilirubin, direct     Status: Abnormal   Collection Time: 08/19/17 12:40 PM  Result Value Ref Range   Bilirubin, Direct 4.0 (H) 0.1 - 0.5 mg/dL  DIC panel     Status: Abnormal   Collection Time: 08/19/17 12:40 PM  Result Value Ref Range   Prothrombin Time 27.0 (H) 11.4 - 15.2 seconds   INR  2.53    aPTT 41 (H) 24 - 36 seconds    Comment:        IF BASELINE aPTT IS ELEVATED, SUGGEST PATIENT RISK ASSESSMENT BE USED TO DETERMINE APPROPRIATE ANTICOAGULANT THERAPY.    Fibrinogen 156 (L) 210 - 475 mg/dL   D-Dimer, Quant 1.44 (H) 0.00 - 0.50 ug/mL-FEU    Comment: (NOTE) At the manufacturer cut-off of 0.50 ug/mL FEU, this assay has been documented to exclude PE with a sensitivity and negative predictive value of 97 to 99%.  At this time, this assay has not been approved by the FDA to exclude DVT/VTE. Results should be correlated with clinical presentation.    Platelets 317 150 - 400 K/uL   Smear Review NO SCHISTOCYTES SEEN   Ammonia     Status: Abnormal  Collection Time: 08/19/17 12:40 PM  Result Value Ref Range   Ammonia 54 (H) 9 - 35 umol/L  I-Stat CG4 Lactic Acid, ED     Status: Abnormal   Collection Time: 08/19/17  2:04 PM  Result Value Ref Range   Lactic Acid, Venous >17.00 (HH) 0.5 - 1.9 mmol/L   Comment NOTIFIED PHYSICIAN   MRSA PCR Screening     Status: None   Collection Time: 08/19/17  2:16 PM  Result Value Ref Range   MRSA by PCR NEGATIVE NEGATIVE    Comment:        The GeneXpert MRSA Assay (FDA approved for NASAL specimens only), is one component of a comprehensive MRSA colonization surveillance program. It is not intended to diagnose MRSA infection nor to guide or monitor treatment for MRSA infections.   Basic metabolic panel     Status: Abnormal   Collection Time: 08/19/17  2:21 PM  Result Value Ref Range   Sodium 112 (LL) 135 - 145 mmol/L    Comment: CRITICAL RESULT CALLED TO, READ BACK BY AND VERIFIED WITH: RN A LEWIS AT 1540 [phone] MARTINB    Potassium 6.3 (HH) 3.5 - 5.1 mmol/L    Comment: SLIGHT HEMOLYSIS HEMOLYSIS AT THIS LEVEL MAY AFFECT RESULT CRITICAL RESULT CALLED TO, READ BACK BY AND VERIFIED WITH: RN A LEWIS AT 1540 [phone] MARTINB    Chloride 68 (L) 101 - 111 mmol/L   CO2 8 (L) 22 - 32 mmol/L   Glucose, Bld 122 (H) 65 - 99  mg/dL   BUN 62 (H) 6 - 20 mg/dL   Creatinine, Ser 5.90 (H) 0.61 - 1.24 mg/dL   Calcium 9.6 8.9 - 10.3 mg/dL   GFR calc non Af Amer 12 (L) >60 mL/min   GFR calc Af Amer 13 (L) >60 mL/min    Comment: (NOTE) The eGFR has been calculated using the CKD EPI equation. This calculation has not been validated in all clinical situations. eGFR's persistently <60 mL/min signify possible Chronic Kidney Disease.    Anion gap 36 (H) 5 - 15    Comment: RESULT REPEATED AND VERIFIED  Magnesium     Status: None   Collection Time: 08/19/17  2:21 PM  Result Value Ref Range   Magnesium 2.0 1.7 - 2.4 mg/dL  Phosphorus     Status: Abnormal   Collection Time: 08/19/17  2:21 PM  Result Value Ref Range   Phosphorus 8.7 (H) 2.5 - 4.6 mg/dL  Osmolality     Status: Abnormal   Collection Time: 08/19/17  2:21 PM  Result Value Ref Range   Osmolality 268 (L) 275 - 295 mOsm/kg  Glucose, capillary     Status: Abnormal   Collection Time: 08/19/17  4:33 PM  Result Value Ref Range   Glucose-Capillary 150 (H) 65 - 99 mg/dL   Comment 1 Notify RN   I-STAT 3, arterial blood gas (G3+)     Status: Abnormal   Collection Time: 08/19/17  5:00 PM  Result Value Ref Range   pH, Arterial 7.537 (H) 7.350 - 7.450   pCO2 arterial 16.8 (LL) 32.0 - 48.0 mmHg   pO2, Arterial 109.0 (H) 83.0 - 108.0 mmHg   Bicarbonate 14.3 (L) 20.0 - 28.0 mmol/L   TCO2 15 (L) 22 - 32 mmol/L   O2 Saturation 99.0 %   Acid-base deficit 5.0 (H) 0.0 - 2.0 mmol/L   Patient temperature 36.7 C    Collection site RADIAL, ALLEN'S TEST ACCEPTABLE    Drawn by RT  Sample type ARTERIAL    Comment NOTIFIED PHYSICIAN   Basic metabolic panel     Status: Abnormal   Collection Time: 08/19/17  8:59 PM  Result Value Ref Range   Sodium 117 (LL) 135 - 145 mmol/L    Comment: CRITICAL RESULT CALLED TO, READ BACK BY AND VERIFIED WITH: [facility] RN 08/19/2017 2139 JORDANS    Potassium 4.5 3.5 - 5.1 mmol/L    Comment: DELTA CHECK NOTED   Chloride 70 (L) 101 - 111  mmol/L   CO2 22 22 - 32 mmol/L   Glucose, Bld 162 (H) 65 - 99 mg/dL   BUN 64 (H) 6 - 20 mg/dL   Creatinine, Ser 5.72 (H) 0.61 - 1.24 mg/dL   Calcium 8.8 (L) 8.9 - 10.3 mg/dL   GFR calc non Af Amer 12 (L) >60 mL/min   GFR calc Af Amer 14 (L) >60 mL/min    Comment: (NOTE) The eGFR has been calculated using the CKD EPI equation. This calculation has not been validated in all clinical situations. eGFR's persistently <60 mL/min signify possible Chronic Kidney Disease.    Anion gap 25 (H) 5 - 15    Comment: RESULT CHECKED  Glucose, capillary     Status: Abnormal   Collection Time: 08/19/17 11:49 PM  Result Value Ref Range   Glucose-Capillary 125 (H) 65 - 99 mg/dL   Comment 1 Notify RN   Urinalysis, Routine w reflex microscopic     Status: Abnormal   Collection Time: 08/20/17  1:24 AM  Result Value Ref Range   Color, Urine AMBER (A) YELLOW    Comment: BIOCHEMICALS MAY BE AFFECTED BY COLOR   APPearance CLOUDY (A) CLEAR   Specific Gravity, Urine 1.016 1.005 - 1.030   pH 5.0 5.0 - 8.0   Glucose, UA 50 (A) NEGATIVE mg/dL   Hgb urine dipstick MODERATE (A) NEGATIVE   Bilirubin Urine NEGATIVE NEGATIVE   Ketones, ur 20 (A) NEGATIVE mg/dL   Protein, ur 100 (A) NEGATIVE mg/dL   Nitrite NEGATIVE NEGATIVE   Leukocytes, UA NEGATIVE NEGATIVE   RBC / HPF 6-30 0 - 5 RBC/hpf   WBC, UA 6-30 0 - 5 WBC/hpf   Bacteria, UA RARE (A) NONE SEEN   Squamous Epithelial / LPF 0-5 (A) NONE SEEN   Mucus PRESENT    Hyaline Casts, UA PRESENT    Ca Oxalate Crys, UA PRESENT   Urine rapid drug screen (hosp performed)     Status: Abnormal   Collection Time: 08/20/17  1:24 AM  Result Value Ref Range   Opiates POSITIVE (A) NONE DETECTED   Cocaine NONE DETECTED NONE DETECTED   Benzodiazepines NONE DETECTED NONE DETECTED   Amphetamines NONE DETECTED NONE DETECTED   Tetrahydrocannabinol NONE DETECTED NONE DETECTED   Barbiturates NONE DETECTED NONE DETECTED    Comment:        DRUG SCREEN FOR MEDICAL  PURPOSES ONLY.  IF CONFIRMATION IS NEEDED FOR ANY PURPOSE, NOTIFY LAB WITHIN 5 DAYS.        LOWEST DETECTABLE LIMITS FOR URINE DRUG SCREEN Drug Class       Cutoff (ng/mL) Amphetamine      1000 Barbiturate      200 Benzodiazepine   301 Tricyclics       601 Opiates          300 Cocaine          300 THC              50   Osmolality,  urine     Status: Abnormal   Collection Time: 08/20/17  1:24 AM  Result Value Ref Range   Osmolality, Ur 299 (L) 300 - 900 mOsm/kg  Magnesium     Status: None   Collection Time: 08/20/17  3:22 AM  Result Value Ref Range   Magnesium 1.7 1.7 - 2.4 mg/dL  Phosphorus     Status: Abnormal   Collection Time: 08/20/17  3:22 AM  Result Value Ref Range   Phosphorus 6.0 (H) 2.5 - 4.6 mg/dL  Comprehensive metabolic panel     Status: Abnormal   Collection Time: 08/20/17  3:22 AM  Result Value Ref Range   Sodium 120 (L) 135 - 145 mmol/L   Potassium 4.5 3.5 - 5.1 mmol/L   Chloride 72 (L) 101 - 111 mmol/L   CO2 24 22 - 32 mmol/L   Glucose, Bld 110 (H) 65 - 99 mg/dL   BUN 70 (H) 6 - 20 mg/dL   Creatinine, Ser 6.03 (H) 0.61 - 1.24 mg/dL   Calcium 8.8 (L) 8.9 - 10.3 mg/dL   Total Protein 5.8 (L) 6.5 - 8.1 g/dL   Albumin 2.8 (L) 3.5 - 5.0 g/dL   AST 302 (H) 15 - 41 U/L   ALT 107 (H) 17 - 63 U/L   Alkaline Phosphatase 49 38 - 126 U/L   Total Bilirubin 7.6 (H) 0.3 - 1.2 mg/dL   GFR calc non Af Amer 11 (L) >60 mL/min   GFR calc Af Amer 13 (L) >60 mL/min    Comment: (NOTE) The eGFR has been calculated using the CKD EPI equation. This calculation has not been validated in all clinical situations. eGFR's persistently <60 mL/min signify possible Chronic Kidney Disease.    Anion gap 24 (H) 5 - 15  Protime-INR     Status: Abnormal   Collection Time: 08/20/17  3:22 AM  Result Value Ref Range   Prothrombin Time 24.6 (H) 11.4 - 15.2 seconds   INR 2.24   Glucose, capillary     Status: Abnormal   Collection Time: 08/20/17  3:50 AM  Result Value Ref Range    Glucose-Capillary 118 (H) 65 - 99 mg/dL   Comment 1 Notify RN   Blood gas, arterial     Status: Abnormal   Collection Time: 08/20/17  4:15 AM  Result Value Ref Range   FIO2 21.00    pH, Arterial 7.539 (H) 7.350 - 7.450   pCO2 arterial 29.3 (L) 32.0 - 48.0 mmHg   pO2, Arterial 100 83.0 - 108.0 mmHg   Bicarbonate 24.9 20.0 - 28.0 mmol/L   Acid-Base Excess 2.3 (H) 0.0 - 2.0 mmol/L   O2 Saturation 97.7 %   Patient temperature 98.6    Collection site RIGHT RADIAL    Drawn by [phone]    Sample type ARTERIAL DRAW    Allens test (pass/fail) PASS PASS  Glucose, capillary     Status: Abnormal   Collection Time: 08/20/17  8:06 AM  Result Value Ref Range   Glucose-Capillary 107 (H) 65 - 99 mg/dL  CBC     Status: Abnormal   Collection Time: 08/20/17 10:45 AM  Result Value Ref Range   WBC 19.3 (H) 4.0 - 10.5 K/uL   RBC 3.63 (L) 4.22 - 5.81 MIL/uL   Hemoglobin 12.3 (L) 13.0 - 17.0 g/dL   HCT 34.7 (L) 39.0 - 52.0 %   MCV 95.6 78.0 - 100.0 fL   MCH 33.9 26.0 - 34.0 pg   MCHC 35.4  30.0 - 36.0 g/dL   RDW 13.8 11.5 - 15.5 %   Platelets 258 150 - 400 K/uL   Ct Abdomen Pelvis Wo Contrast  Result Date: 08/19/2017 CLINICAL DATA:  New onset jaundice to sclera, swelling ankles. Shortness of breath. EXAM: CT CHEST, ABDOMEN AND PELVIS WITHOUT CONTRAST TECHNIQUE: Multidetector CT imaging of the chest, abdomen and pelvis was performed following the standard protocol without IV contrast. COMPARISON:  None. FINDINGS: CT CHEST FINDINGS Cardiovascular: Heart size is upper normal. No pericardial effusion. No thoracic aortic aneurysm. Mediastinum/Nodes: No mass or enlarged lymph nodes seen within the mediastinum or perihilar regions. Esophagus is unremarkable. Trachea and central bronchi are unremarkable. Lungs/Pleura: Small right pleural effusion. Lungs are otherwise clear. No pneumonia or pulmonary edema. No pneumothorax. Musculoskeletal: No acute or significant osseous findings. CT ABDOMEN PELVIS FINDINGS  Hepatobiliary: Liver is diffusely/markedly low in density suggesting fatty infiltration, alternatively edematous related to hepatitis. No focal liver mass or lesion seen. Gallbladder is unremarkable.  No bile duct dilatation. Pancreas: Unremarkable. No pancreatic ductal dilatation or surrounding inflammatory changes. Spleen: Normal in size without focal abnormality. Adrenals/Urinary Tract: Adrenal glands are unremarkable. Kidneys are unremarkable without mass, stone or hydronephrosis. Bladder is decompressed. Stomach/Bowel: Bowel is normal in caliber. No bowel wall thickening or evidence of bowel wall inflammation. Vascular/Lymphatic: No significant vascular findings are present. No enlarged abdominal or pelvic lymph nodes. Reproductive: Prostate is unremarkable. Other: Small amount of free fluid and fluid stranding within the lower pelvis. No circumscribed fluid collection or abscess like collection. No free fluid appreciated within the upper abdomen. Musculoskeletal: Ill-defined fluid/edema throughout the superficial soft tissues indicating anasarca. Associated edematous scrotal wall thickening. No acute or significant osseous finding. IMPRESSION: 1. Liver is diffusely/markedly low in density suggesting either fatty infiltration or edema related to hepatitis or other infectious/inflammatory process. There is associated hepatomegaly (right liver lobe measures 21 cm length). No focal mass or lesion appreciated within the liver. 2. Gallbladder is unremarkable.  No bile duct dilatation. 3. Anasarca. 4. Scrotal wall thickening (edematous), likely related to the anasarca. 5. Small amount of free fluid/fluid stranding in the lower pelvis. No abscess collection. 6. Small right pleural effusion. Lungs otherwise clear. No pneumonia or pulmonary edema. Electronically Signed   By: Franki Cabot M.D.   On: 08/19/2017 12:17   Ct Chest Wo Contrast  Result Date: 08/19/2017 CLINICAL DATA:  New onset jaundice to sclera, swelling  ankles. Shortness of breath. EXAM: CT CHEST, ABDOMEN AND PELVIS WITHOUT CONTRAST TECHNIQUE: Multidetector CT imaging of the chest, abdomen and pelvis was performed following the standard protocol without IV contrast. COMPARISON:  None. FINDINGS: CT CHEST FINDINGS Cardiovascular: Heart size is upper normal. No pericardial effusion. No thoracic aortic aneurysm. Mediastinum/Nodes: No mass or enlarged lymph nodes seen within the mediastinum or perihilar regions. Esophagus is unremarkable. Trachea and central bronchi are unremarkable. Lungs/Pleura: Small right pleural effusion. Lungs are otherwise clear. No pneumonia or pulmonary edema. No pneumothorax. Musculoskeletal: No acute or significant osseous findings. CT ABDOMEN PELVIS FINDINGS Hepatobiliary: Liver is diffusely/markedly low in density suggesting fatty infiltration, alternatively edematous related to hepatitis. No focal liver mass or lesion seen. Gallbladder is unremarkable.  No bile duct dilatation. Pancreas: Unremarkable. No pancreatic ductal dilatation or surrounding inflammatory changes. Spleen: Normal in size without focal abnormality. Adrenals/Urinary Tract: Adrenal glands are unremarkable. Kidneys are unremarkable without mass, stone or hydronephrosis. Bladder is decompressed. Stomach/Bowel: Bowel is normal in caliber. No bowel wall thickening or evidence of bowel wall inflammation. Vascular/Lymphatic: No significant vascular findings are  present. No enlarged abdominal or pelvic lymph nodes. Reproductive: Prostate is unremarkable. Other: Small amount of free fluid and fluid stranding within the lower pelvis. No circumscribed fluid collection or abscess like collection. No free fluid appreciated within the upper abdomen. Musculoskeletal: Ill-defined fluid/edema throughout the superficial soft tissues indicating anasarca. Associated edematous scrotal wall thickening. No acute or significant osseous finding. IMPRESSION: 1. Liver is diffusely/markedly low in  density suggesting either fatty infiltration or edema related to hepatitis or other infectious/inflammatory process. There is associated hepatomegaly (right liver lobe measures 21 cm length). No focal mass or lesion appreciated within the liver. 2. Gallbladder is unremarkable.  No bile duct dilatation. 3. Anasarca. 4. Scrotal wall thickening (edematous), likely related to the anasarca. 5. Small amount of free fluid/fluid stranding in the lower pelvis. No abscess collection. 6. Small right pleural effusion. Lungs otherwise clear. No pneumonia or pulmonary edema. Electronically Signed   By: Franki Cabot M.D.   On: 08/19/2017 12:17   Dg Chest Port 1 View  Result Date: 08/19/2017 CLINICAL DATA:  Patient status post central line placement. EXAM: PORTABLE CHEST 1 VIEW COMPARISON:  CT 08/19/2017 FINDINGS: Central venous catheter is present with tip projecting over the superior vena cava. Monitoring leads overlie the patient. Stable cardiac and mediastinal contours. No consolidative pulmonary opacities. No pleural effusion or pneumothorax. IMPRESSION: Central venous catheter tip projects over the superior vena cava. Cardiomegaly. No acute cardiopulmonary process. Electronically Signed   By: Lovey Newcomer M.D.   On: 08/19/2017 18:28   Dg Chest Portable 1 View  Result Date: 08/19/2017 CLINICAL DATA:  Patient with recent onset jaundice. EXAM: PORTABLE CHEST 1 VIEW COMPARISON:  None. FINDINGS: Monitoring leads overlie the patient. Cardiac contours upper limits of normal. No consolidative pulmonary opacities. No pleural effusion or pneumothorax. IMPRESSION: Mild cardiomegaly.  No acute cardiopulmonary process. Electronically Signed   By: Lovey Newcomer M.D.   On: 08/19/2017 10:54   US Abdomen Limited Ruq  Result Date: 08/20/2017 CLINICAL DATA:  Jaundice. EXAM: ULTRASOUND ABDOMEN LIMITED RIGHT UPPER QUADRANT COMPARISON:  CT chest, abdomen and pelvis August 19, 2017 FINDINGS: Gallbladder: Small amount of layering  gallbladder sludge without wall thickening or pericholecystic fluid. No sonographic Murphy's sign elicited. Common bile duct: Diameter: 4 mm Liver: Mild hepatomegaly at 20.6 cm. The liver is diffusely echogenic with bidirectional portal flow, portal veins are patent. No intrahepatic masses or biliary dilatation. Trace perihepatic ascites. IMPRESSION: 1. Hepatomegaly with increased echotexture seen with hepatic steatosis/hepatocellular disease. 2. Patent portal vein with bidirectional flow, seen with cirrhosis/hepatic fibrosis and heart failure. 3. Gallbladder sludge without acute cholecystitis. Electronically Signed   By: Elon Alas M.D.   On: 08/20/2017 01:26    ROS : all other systems reviewed and are negative  Blood pressure (!) 107/53, pulse (!) 109, temperature 98.1 F (36.7 C), temperature source Oral, resp. rate (!) 22, height 5' 10"  (1.778 m), weight 68 kg (150 lb), SpO2 100 %. Physical Exam   Assessment/Plan 1.  Acute kidney injury: no prior history of kidney issues.  I suspect this may be all related to his acute liver injury but will get ANA, ANCA, complements (note that we will have to interpret these in the setting of other values as these are hepatically synthesized) and a UP/C ratio to rule out systemic autoimmune causes of illness which could explain both.  Also getting anti-smooth muscle and anti-microsomal LK as well.  Agree with Lasix stress test of 80 mg (1 mg/ kg) and if not successful will  need CRRT for management of fluids and electrolytes.    2.  Acute liver injury: unclear etiology so far, ? Alcoholic hepatitis based on echogenicity of liver.  Hepatitis panel and HIV pending.  Other viral causes (CMV, EBV, tick-borne illnesses) could be considered but these are less likely.  Liver US with doppler without clot.  Tylenol level negative.  On NAC.  3.  Metabolic acidosis: off bicarb gtt.  Likely from lactate of 17.  Unable to calculate compensation from initial values.  4.   Hyponatremia: resolving with fluids, will ensure no overcorrection if we start CRRT  5.  Hyperkalemia: resolved at present.  If needed can use patiromer to bind K.   Madelon Lips, MD [facility]  pgr [phone] 08/20/2017, 11:24 AM

## 2017-08-20 NOTE — Progress Notes (Addendum)
PULMONARY / CRITICAL CARE MEDICINE   Name: Seth HoyerCody Wellbrock MRN: [id number] DOB: 12/20/1985    ADMISSION DATE:  08/19/2017  CHIEF COMPLAINT:  Abdominal distention, jaundice, and dyspnea.  HISTORY OF PRESENT ILLNESS:   This is a 31 year old who normally enjoys normal active health. Approximately 2 weeks ago he started having difficulties with lower extremity swelling and in more recent days abdominal swelling which makes it so dispensed don't fit. He has noticed jaundice and fatigue, and is now suffering from nausea and vomiting. He is not having any abdominal pain. He has no known history of liver disease but does drink 3-4 hard ciders a day. He has a history of opiate abuse, but denies ever using needles in the past or having history of exposure to hepatitis.  PAST MEDICAL HISTORY :  He  has a past medical history of Eczema and Heartburn.  PAST SURGICAL HISTORY: He  has no past surgical history on file.  No Known Allergies   His been taking Suboxone under supervision for the past 2 years  No current facility-administered medications on file prior to encounter.    No current outpatient prescriptions on file prior to encounter.    FAMILY HISTORY:  He has no family history of early liver disease. [place] he is not aware of any problems with copper metabolism and has never heard of the words Wilson's disease.   SOCIAL HISTORY: He drinks alcohol as noted. He smokes 1/2-1 pack of cigarettes per day. Reports no use of street drugs for the past 2 years and denies ever having used needles. 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  REVIEW OF SYSTEMS:   10 system of the review of systems is remarkably positive. Positive findings include easy fatigue. Easy bruising with ecchymoses on the upper extremities. He has no prior history of shortness of breath no history of asthma bronchitis TB or hemoptysis. He has been  having some chest discomfort reasonably which she describes as going through to his back as well as epigastric discomfort which goes to his back as well. This is not described as terrible tearing pain. He otherwise has no history of known heart disease. He has had no difficulties with her abdominal discomfort . He is having some difficulties with nausea and diarrhea, his vomitus is described as watery and the diarrhea Green. He is no history of diabetes or thyroid disease. The remainder of the review of systems is negative   SUBJECTIVE:  Feels better today. Still on NAC, bicarb drip Chest pain is better  VITAL SIGNS: BP 119/66   Pulse 65   Temp 98.1 F (36.7 C) (Oral)   Resp 19   Ht 5\' 10"  (1.778 m)   Wt 150 lb (68 kg)   SpO2 (!) 82%   BMI 21.52 kg/m   HEMODYNAMICS: CVP:  [8 mmHg-14 mmHg] 14 mmHg  VENTILATOR SETTINGS: FiO2 (%):  [100 %] 100 %  INTAKE / OUTPUT: I/O last 3 completed shifts: In: 3406.2 [I.V.:3346.2; Other:10; IV Piggyback:50] Out: 110 [Urine:110]  PHYSICAL EXAMINATION: Blood pressure 102/74, pulse (!) 102, temperature 98.1 F (36.7 C), temperature source Oral, resp. rate (!) 22, height 5\' 10"  (1.778 m), weight 150 lb (68 kg), SpO2 100 %. Gen:      No acute distress HEENT:  EOMI, sclera, icteric Neck:     No masses; no thyromegaly Lungs:    Clear to auscultation bilaterally; normal respiratory effort  CV:         Regular rate and rhythm; no murmurs Abd:      + bowel sounds; soft, non-tender; no palpable masses, no distension Ext:    Trace edema; adequate peripheral perfusion Skin:      Warm and dry; no rash Neuro: alert and oriented x 3 Psych: normal mood and affect,  LABS:  BMET  Recent Labs Lab 08/19/17 1421 08/19/17 2059 08/20/17 0322  NA 112* 117* 120*  K 6.3* 4.5 4.5  CL 68* 70* 72*  CO2 8* 22 24  BUN 62* 64* 70*  CREATININE 5.90* 5.72* 6.03*  GLUCOSE 122* 162* 110*    Electrolytes  Recent Labs Lab 08/19/17 1421 08/19/17 2059  08/20/17 0322  CALCIUM 9.6 8.8* 8.8*  MG 2.0  --  1.7  PHOS 8.7*  --  6.0*    CBC  Recent Labs Lab 08/19/17 1030 08/19/17 1240  WBC 15.5*  --   HGB 12.2*  --   HCT 34.9*  --   PLT 348 317    Coag's  Recent Labs Lab 08/19/17 1030 08/19/17 1240 08/20/17 0322  APTT 40* 41*  --   INR 2.42 2.53 2.24    Sepsis Markers  Recent Labs Lab 08/19/17 1116 08/19/17 1404  LATICACIDVEN >17.00* >17.00*    ABG  Recent Labs Lab 08/19/17 1130 08/19/17 1700 08/20/17 0415  PHART 7.410 7.537* 7.539*  PCO2ART CRITICAL RESULT CALLED TO, READ BACK BY AND VERIFIED WITH: 16.8* 29.3*  PO2ART 275* 109.0* 100    Liver Enzymes  Recent Labs Lab 08/19/17 1030 08/20/17 0322  AST <5* 302*  ALT <5* 107*  ALKPHOS 60 49  BILITOT 8.8* 7.6*  ALBUMIN 3.4* 2.8*    Cardiac Enzymes No results for input(s): TROPONINI, PROBNP in the last 168 hours.  Glucose  Recent Labs Lab 08/19/17 1633 08/19/17 2349 08/20/17 0350 08/20/17 0806  GLUCAP 150* 125* 118* 107*    Imaging   STUDIES:  Right upper quadrant ultrasound with Doppler to evaluate portal flow is pending  CULTURES:  ANTIBIOTICS:  SIGNIFICANT EVENTS:   LINES/TUBES: Lt [place] CVL> 9/9  ASSESSMENT / PLAN: Acute hepatic, renal failure of unclear etiology Associated with lactic acidosis, shock  On talking with the patient he said he ate some mushrooms from his yard about 1.5 months ago. No new meds or diet. Travel to [place] in April. No other recent travel. Drinks 3-4 hard ciders/day  Acute liver failure ? Mushroom toxicity, alcohol hepatitis Normal tylenol levels Continue nac Awaiting liver U/S with dopplers Follow hepatitis panel Vit K, Follow LFTs Will discuss with tertiary liver center  Renal failure- Anuric With acidosis Stop bicarb drip as Ph is high Trail of lasix. 40 mg once Nephrology consult today AM  Chest pain- Atypical Follow troponin, echo  The patient is critically ill with multiple  organ system failure and requires high complexity decision making for assessment and support, frequent evaluation and titration of therapies, advanced monitoring, review of radiographic studies and interpretation of complex data.   Critical Care Time devoted to patient care services, exclusive of separately billable procedures, described in this note is 35 minutes.   Chilton Greathouse MD [facility] Pager [phone] If no answer or after 3pm call: [phone] 08/20/2017, 10:06 AM

## 2017-08-20 NOTE — Progress Notes (Signed)
  Echocardiogram 2D Echocardiogram has been performed.  Nolon RodBrown, Tony 08/20/2017, 10:57 AM

## 2017-08-20 NOTE — Progress Notes (Signed)
Progress Note  Patient Name: Seth Obrien Date of Encounter: 08/20/2017  Primary Cardiologist: new  Subjective   Chest pain/epigastric discomfort resolved after receiving Pepcid and Protonix. He reports this has been a long-standing issue, preceding this admission. Quick bedside review of echocardiogram shows normal left ventricular regional wall motion and normal LVEF around 55%. There is mild mitral insufficiency. The only abnormality is subtle dilation of the left atrium. Nothing to raise suspicion for an acute coronary event or myocarditis.  Inpatient Medications    Scheduled Meds: . buprenorphine-naloxone  1 tablet Sublingual BID  . multivitamins with iron  1 tablet Oral Daily  . pantoprazole (PROTONIX) IV  40 mg Intravenous QHS  . thiamine injection  100 mg Intravenous Daily   Continuous Infusions: . sodium chloride    . acetylcysteine (ACETADOTE) 40,000 mg in dextrose 5 % 800 mL infusion 11 mL/hr at 08/19/17 1953  . phenylephrine (NEO-SYNEPHRINE) Adult infusion 75 mcg/min (08/20/17 1112)   PRN Meds: sodium chloride, fentaNYL (SUBLIMAZE) injection, ondansetron (ZOFRAN) IV   Vital Signs    Vitals:   08/20/17 1015 08/20/17 1030 08/20/17 1045 08/20/17 1100  BP: (!) 116/57 (!) 113/48 (!) 107/53   Pulse: (!) 103 (!) 102 (!) 102 (!) 109  Resp: 16 (!) 22 17 (!) 22  Temp:      TempSrc:      SpO2: 100% 100% 100% 100%  Weight:      Height:        Intake/Output Summary (Last 24 hours) at 08/20/17 1115 Last data filed at 08/20/17 1111  Gross per 24 hour  Intake             3795 ml  Output              160 ml  Net             3635 ml   Filed Weights   08/19/17 1007 08/19/17 1416  Weight: 150 lb (68 kg) 150 lb (68 kg)    Telemetry    Sinus tachycardia - Personally Reviewed  ECG    Sinus tachycardia with subtle ST segment depression in the inferior leads - Personally Reviewed  Physical Exam  A little tachypnea, otherwise appears relaxed and comfortable GEN:  No acute distress.   Neck: No JVD Cardiac: RRR, 1/6 apical systolic murmur. no diastolic murmurs, rubs, or gallops.  Respiratory: Clear to auscultation bilaterally. GI: Soft, nontender, non-distended  MS: No edema; No deformity. Neuro:  Nonfocal  Psych: Normal affect   Labs    Chemistry Recent Labs Lab 08/19/17 1030 08/19/17 1421 08/19/17 2059 08/20/17 0322  NA 113* 112* 117* 120*  K 5.7* 6.3* 4.5 4.5  CL 69* 68* 70* 72*  CO2 <7* 8* 22 24  GLUCOSE 107* 122* 162* 110*  BUN 59* 62* 64* 70*  CREATININE 5.89* 5.90* 5.72* 6.03*  CALCIUM 9.9 9.6 8.8* 8.8*  PROT 6.7  --   --  5.8*  ALBUMIN 3.4*  --   --  2.8*  AST <5*  --   --  302*  ALT <5*  --   --  107*  ALKPHOS 60  --   --  49  BILITOT 8.8*  --   --  7.6*  GFRNONAA 12* 12* 12* 11*  GFRAA 13* 13* 14* 13*  ANIONGAP  --  36* 25* 24*     Hematology Recent Labs Lab 08/19/17 1030 08/19/17 1240 08/20/17 1045  WBC 15.5*  --  19.3*  RBC 3.56*  --  3.63*  HGB 12.2*  --  12.3*  HCT 34.9*  --  34.7*  MCV 98.0  --  95.6  MCH 34.3*  --  33.9  MCHC 35.0  --  35.4  RDW 13.4  --  13.8  PLT 348 317 258    Cardiac EnzymesNo results for input(s): TROPONINI in the last 168 hours.  Recent Labs Lab 08/19/17 1114  TROPIPOC 0.27*     BNP Recent Labs Lab 08/19/17 1030  BNP 3,252.8*     DDimer  Recent Labs Lab 08/19/17 1240  DDIMER 1.44*     Radiology    Ct Abdomen Pelvis Wo Contrast  Result Date: 08/19/2017 CLINICAL DATA:  New onset jaundice to sclera, swelling ankles. Shortness of breath. EXAM: CT CHEST, ABDOMEN AND PELVIS WITHOUT CONTRAST TECHNIQUE: Multidetector CT imaging of the chest, abdomen and pelvis was performed following the standard protocol without IV contrast. COMPARISON:  None. FINDINGS: CT CHEST FINDINGS Cardiovascular: Heart size is upper normal. No pericardial effusion. No thoracic aortic aneurysm. Mediastinum/Nodes: No mass or enlarged lymph nodes seen within the mediastinum or perihilar regions.  Esophagus is unremarkable. Trachea and central bronchi are unremarkable. Lungs/Pleura: Small right pleural effusion. Lungs are otherwise clear. No pneumonia or pulmonary edema. No pneumothorax. Musculoskeletal: No acute or significant osseous findings. CT ABDOMEN PELVIS FINDINGS Hepatobiliary: Liver is diffusely/markedly low in density suggesting fatty infiltration, alternatively edematous related to hepatitis. No focal liver mass or lesion seen. Gallbladder is unremarkable.  No bile duct dilatation. Pancreas: Unremarkable. No pancreatic ductal dilatation or surrounding inflammatory changes. Spleen: Normal in size without focal abnormality. Adrenals/Urinary Tract: Adrenal glands are unremarkable. Kidneys are unremarkable without mass, stone or hydronephrosis. Bladder is decompressed. Stomach/Bowel: Bowel is normal in caliber. No bowel wall thickening or evidence of bowel wall inflammation. Vascular/Lymphatic: No significant vascular findings are present. No enlarged abdominal or pelvic lymph nodes. Reproductive: Prostate is unremarkable. Other: Small amount of free fluid and fluid stranding within the lower pelvis. No circumscribed fluid collection or abscess like collection. No free fluid appreciated within the upper abdomen. Musculoskeletal: Ill-defined fluid/edema throughout the superficial soft tissues indicating anasarca. Associated edematous scrotal wall thickening. No acute or significant osseous finding. IMPRESSION: 1. Liver is diffusely/markedly low in density suggesting either fatty infiltration or edema related to hepatitis or other infectious/inflammatory process. There is associated hepatomegaly (right liver lobe measures 21 cm length). No focal mass or lesion appreciated within the liver. 2. Gallbladder is unremarkable.  No bile duct dilatation. 3. Anasarca. 4. Scrotal wall thickening (edematous), likely related to the anasarca. 5. Small amount of free fluid/fluid stranding in the lower pelvis. No  abscess collection. 6. Small right pleural effusion. Lungs otherwise clear. No pneumonia or pulmonary edema. Electronically Signed   By: Bary Richard M.D.   On: 08/19/2017 12:17   Ct Chest Wo Contrast  Result Date: 08/19/2017 CLINICAL DATA:  New onset jaundice to sclera, swelling ankles. Shortness of breath. EXAM: CT CHEST, ABDOMEN AND PELVIS WITHOUT CONTRAST TECHNIQUE: Multidetector CT imaging of the chest, abdomen and pelvis was performed following the standard protocol without IV contrast. COMPARISON:  None. FINDINGS: CT CHEST FINDINGS Cardiovascular: Heart size is upper normal. No pericardial effusion. No thoracic aortic aneurysm. Mediastinum/Nodes: No mass or enlarged lymph nodes seen within the mediastinum or perihilar regions. Esophagus is unremarkable. Trachea and central bronchi are unremarkable. Lungs/Pleura: Small right pleural effusion. Lungs are otherwise clear. No pneumonia or pulmonary edema. No pneumothorax. Musculoskeletal: No acute or significant osseous findings. CT ABDOMEN PELVIS FINDINGS Hepatobiliary: Liver  is diffusely/markedly low in density suggesting fatty infiltration, alternatively edematous related to hepatitis. No focal liver mass or lesion seen. Gallbladder is unremarkable.  No bile duct dilatation. Pancreas: Unremarkable. No pancreatic ductal dilatation or surrounding inflammatory changes. Spleen: Normal in size without focal abnormality. Adrenals/Urinary Tract: Adrenal glands are unremarkable. Kidneys are unremarkable without mass, stone or hydronephrosis. Bladder is decompressed. Stomach/Bowel: Bowel is normal in caliber. No bowel wall thickening or evidence of bowel wall inflammation. Vascular/Lymphatic: No significant vascular findings are present. No enlarged abdominal or pelvic lymph nodes. Reproductive: Prostate is unremarkable. Other: Small amount of free fluid and fluid stranding within the lower pelvis. No circumscribed fluid collection or abscess like collection. No  free fluid appreciated within the upper abdomen. Musculoskeletal: Ill-defined fluid/edema throughout the superficial soft tissues indicating anasarca. Associated edematous scrotal wall thickening. No acute or significant osseous finding. IMPRESSION: 1. Liver is diffusely/markedly low in density suggesting either fatty infiltration or edema related to hepatitis or other infectious/inflammatory process. There is associated hepatomegaly (right liver lobe measures 21 cm length). No focal mass or lesion appreciated within the liver. 2. Gallbladder is unremarkable.  No bile duct dilatation. 3. Anasarca. 4. Scrotal wall thickening (edematous), likely related to the anasarca. 5. Small amount of free fluid/fluid stranding in the lower pelvis. No abscess collection. 6. Small right pleural effusion. Lungs otherwise clear. No pneumonia or pulmonary edema. Electronically Signed   By: Bary RichardStan  Maynard M.D.   On: 08/19/2017 12:17   Dg Chest Port 1 View  Result Date: 08/19/2017 CLINICAL DATA:  Patient status post central line placement. EXAM: PORTABLE CHEST 1 VIEW COMPARISON:  CT 08/19/2017 FINDINGS: Central venous catheter is present with tip projecting over the superior vena cava. Monitoring leads overlie the patient. Stable cardiac and mediastinal contours. No consolidative pulmonary opacities. No pleural effusion or pneumothorax. IMPRESSION: Central venous catheter tip projects over the superior vena cava. Cardiomegaly. No acute cardiopulmonary process. Electronically Signed   By: Annia Beltrew  Davis M.D.   On: 08/19/2017 18:28   Dg Chest Portable 1 View  Result Date: 08/19/2017 CLINICAL DATA:  Patient with recent onset jaundice. EXAM: PORTABLE CHEST 1 VIEW COMPARISON:  None. FINDINGS: Monitoring leads overlie the patient. Cardiac contours upper limits of normal. No consolidative pulmonary opacities. No pleural effusion or pneumothorax. IMPRESSION: Mild cardiomegaly.  No acute cardiopulmonary process. Electronically Signed   By:  Annia Beltrew  Davis M.D.   On: 08/19/2017 10:54   [place] Abdomen Limited Ruq  Result Date: 08/20/2017 CLINICAL DATA:  Jaundice. EXAM: ULTRASOUND ABDOMEN LIMITED RIGHT UPPER QUADRANT COMPARISON:  CT chest, abdomen and pelvis August 19, 2017 FINDINGS: Gallbladder: Small amount of layering gallbladder sludge without wall thickening or pericholecystic fluid. No sonographic Murphy's sign elicited. Common bile duct: Diameter: 4 mm Liver: Mild hepatomegaly at 20.6 cm. The liver is diffusely echogenic with bidirectional portal flow, portal veins are patent. No intrahepatic masses or biliary dilatation. Trace perihepatic ascites. IMPRESSION: 1. Hepatomegaly with increased echotexture seen with hepatic steatosis/hepatocellular disease. 2. Patent portal vein with bidirectional flow, seen with cirrhosis/hepatic fibrosis and heart failure. 3. Gallbladder sludge without acute cholecystitis. Electronically Signed   By: Awilda Metroourtnay  Bloomer M.D.   On: 08/20/2017 01:26    Cardiac Studies   Preliminary review of echo shows normal left ventricle systolic function and regional wall motion. Diastolic function cannot be assessed due to tachycardia. Mild left atrial dilation, mild mitral insufficiency, otherwise normal study   Patient Profile     31 y.o. male with anasarca, acute liver failure and acute renal  failure of unclear etiology, remote history of opiate addiction currently on Suboxone supervised use, chronic alcohol use and recent ingestion of mushrooms from his own backyard. Had transient complaints of chest pain, now resolved and elevated BNP.  Assessment & Plan    No evidence of structural cardiac disease that could explain any of his presentation. Very low suspicion for coronary disease. Elevation in BNP is likely related to massive volume overload in the setting of liver and kidney failure. BNP elevation has been correlated with stage of cirrhosis as well as with likelihood of progression, although not aware of its  relationship to acute liver insufficiency. No further cardiac evaluation is planned at this time. Please reconsult if needed.  For questions or updates, please contact [facility] Please consult [email or web address] for contact info under Cardiology/STEMI. Daytime calls, contact the Day Call APP (6a-8a) or assigned team (Teams A-D) provider (7:30a - 5p). All other daytime calls (7:30-5p), contact the Card Master @ [phone].   Nighttime calls, contact the assigned APP (5p-8p) or MD (6:30p-8p). Overnight calls (8p-6a), contact the on call Fellow @ [phone].      Signed, Thurmon Fair, MD  08/20/2017, 11:15 AM

## 2017-08-20 NOTE — Progress Notes (Signed)
Subjective: Mild upper abdominal pain.  Objective: Vital signs in last 24 hours: Temp:  [98 F (36.7 C)-99.2 F (37.3 C)] 98.2 F (36.8 C) (09/10 1142) Pulse Rate:  [35-113] 104 (09/10 1200) Resp:  [6-32] 21 (09/10 1200) BP: (75-131)/(30-89) 104/62 (09/10 1145) SpO2:  [43 %-100 %] 100 % (09/10 1200) Weight:  [150 lb (68 kg)] 150 lb (68 kg) (09/09 1416) Weight change:  Last BM Date: 08/19/17  PE: GEN:  Jaundiced, NAD ABD:  Soft, mild upper abdominal tenderness  Lab Results: CBC    Component Value Date/Time   WBC 19.3 (H) 08/20/2017 1045   RBC 3.63 (L) 08/20/2017 1045   HGB 12.3 (L) 08/20/2017 1045   HCT 34.7 (L) 08/20/2017 1045   PLT 258 08/20/2017 1045   MCV 95.6 08/20/2017 1045   MCH 33.9 08/20/2017 1045   MCHC 35.4 08/20/2017 1045   RDW 13.8 08/20/2017 1045   LYMPHSABS 1.3 08/19/2017 1030   MONOABS 1.5 (H) 08/19/2017 1030   EOSABS 0.0 08/19/2017 1030   BASOSABS 0.0 08/19/2017 1030   CMP     Component Value Date/Time   NA 120 (L) 08/20/2017 0322   K 4.5 08/20/2017 0322   CL 72 (L) 08/20/2017 0322   CO2 24 08/20/2017 0322   GLUCOSE 110 (H) 08/20/2017 0322   BUN 70 (H) 08/20/2017 0322   CREATININE 6.03 (H) 08/20/2017 0322   CALCIUM 8.8 (L) 08/20/2017 0322   PROT 5.8 (L) 08/20/2017 0322   ALBUMIN 2.8 (L) 08/20/2017 0322   AST 302 (H) 08/20/2017 0322   ALT 107 (H) 08/20/2017 0322   ALKPHOS 49 08/20/2017 0322   BILITOT 7.6 (H) 08/20/2017 0322   GFRNONAA 11 (L) 08/20/2017 0322   GFRAA 13 (L) 08/20/2017 0322   Assessment:  1.  Elevated LFTs, downtrending but with synthetic dysfusion, without coagulopathy.  EtOH hepatitis versus autoimmune process versus other. 2.  Acute kidney injury.  Plan:  1.  Follow LFTs.  I am not convinced enough about etiology of his elevated LFTs to pursue any direct targeted therapy, until we receive some of his serologies back.   2.  If there remains clinical concern about exact etiology of findings, would have to consider liver  biopsy in the next couple days. 3.  Renal input re: possible etiology of issues, assess for hepatorenal syndrome, etc, much appreciated. 4.  Ultrasound with doppler not suggestive of Budd Chiari syndrome, but middle hepatic veins not seen; given acute kidney injury, can't pursue contrasted scan at the present time. 5.  Will follow.   Freddy JakschOUTLAW,Santita Hunsberger M 08/20/2017, 12:47 PM   Cell [phone] If no answer or after 5 PM call [phone]

## 2017-08-21 ENCOUNTER — Inpatient Hospital Stay (HOSPITAL_COMMUNITY): Payer: PRIVATE HEALTH INSURANCE

## 2017-08-21 ENCOUNTER — Encounter (HOSPITAL_COMMUNITY): Payer: Self-pay | Admitting: *Deleted

## 2017-08-21 DIAGNOSIS — R748 Abnormal levels of other serum enzymes: Secondary | ICD-10-CM

## 2017-08-21 LAB — RENAL FUNCTION PANEL
ALBUMIN: 2.5 g/dL — AB (ref 3.5–5.0)
ALBUMIN: 2.4 g/dL — AB (ref 3.5–5.0)
ANION GAP: 17 — AB (ref 5–15)
Anion gap: 15 (ref 5–15)
BUN: 68 mg/dL — ABNORMAL HIGH (ref 6–20)
BUN: 56 mg/dL — AB (ref 6–20)
CALCIUM: 8 mg/dL — AB (ref 8.9–10.3)
CHLORIDE: 86 mmol/L — AB (ref 101–111)
CO2: 25 mmol/L (ref 22–32)
CO2: 26 mmol/L (ref 22–32)
CREATININE: 3.39 mg/dL — AB (ref 0.61–1.24)
Calcium: 7.9 mg/dL — ABNORMAL LOW (ref 8.9–10.3)
Chloride: 81 mmol/L — ABNORMAL LOW (ref 101–111)
Creatinine, Ser: 4.9 mg/dL — ABNORMAL HIGH (ref 0.61–1.24)
GFR calc non Af Amer: 14 mL/min — ABNORMAL LOW (ref >60)
GFR, EST AFRICAN AMERICAN: 17 mL/min — AB (ref >60)
GFR, EST AFRICAN AMERICAN: 26 mL/min — AB (ref >60)
GFR, EST NON AFRICAN AMERICAN: 23 mL/min — AB (ref >60)
GLUCOSE: 112 mg/dL — AB (ref 65–99)
Glucose, Bld: 132 mg/dL — ABNORMAL HIGH (ref 65–99)
PHOSPHORUS: 5.2 mg/dL — AB (ref 2.5–4.6)
PHOSPHORUS: 4.3 mg/dL (ref 2.5–4.6)
POTASSIUM: 3.7 mmol/L (ref 3.5–5.1)
Potassium: 3.4 mmol/L — ABNORMAL LOW (ref 3.5–5.1)
Sodium: 123 mmol/L — ABNORMAL LOW (ref 135–145)
Sodium: 127 mmol/L — ABNORMAL LOW (ref 135–145)

## 2017-08-21 LAB — BLOOD GAS, ARTERIAL
ACID-BASE DEFICIT: 19.3 mmol/L — AB (ref 0.0–2.0)
BICARBONATE: 5.6 mmol/L — AB (ref 20.0–28.0)
Drawn by: 301361
FIO2: 100
O2 Saturation: 99.6 %
PATIENT TEMPERATURE: 97.4
PH ART: 7.41 (ref 7.350–7.450)
pO2, Arterial: 275 mmHg — ABNORMAL HIGH (ref 83.0–108.0)

## 2017-08-21 LAB — LACTIC ACID, PLASMA: LACTIC ACID, VENOUS: 1.3 mmol/L (ref 0.5–1.9)

## 2017-08-21 LAB — ANA W/REFLEX IF POSITIVE: Anti Nuclear Antibody(ANA): NEGATIVE

## 2017-08-21 LAB — C3 COMPLEMENT: C3 Complement: 55 mg/dL — ABNORMAL LOW (ref 82–167)

## 2017-08-21 LAB — HEPATIC FUNCTION PANEL
ALBUMIN: 2.5 g/dL — AB (ref 3.5–5.0)
ALT: 107 U/L — ABNORMAL HIGH (ref 17–63)
AST: 271 U/L — AB (ref 15–41)
Alkaline Phosphatase: 50 U/L (ref 38–126)
BILIRUBIN DIRECT: 3.9 mg/dL — AB (ref 0.1–0.5)
Indirect Bilirubin: 3 mg/dL — ABNORMAL HIGH (ref 0.3–0.9)
Total Bilirubin: 6.9 mg/dL — ABNORMAL HIGH (ref 0.3–1.2)
Total Protein: 5.1 g/dL — ABNORMAL LOW (ref 6.5–8.1)

## 2017-08-21 LAB — GLUCOSE, CAPILLARY
GLUCOSE-CAPILLARY: 108 mg/dL — AB (ref 65–99)
GLUCOSE-CAPILLARY: 112 mg/dL — AB (ref 65–99)
GLUCOSE-CAPILLARY: 113 mg/dL — AB (ref 65–99)

## 2017-08-21 LAB — ANCA TITERS: C-ANCA: <1:20 {titer}

## 2017-08-21 LAB — SODIUM: Sodium: 125 mmol/L — ABNORMAL LOW (ref 135–145)

## 2017-08-21 LAB — CBC
HEMATOCRIT: 31.4 % — AB (ref 39.0–52.0)
HEMOGLOBIN: 11.1 g/dL — AB (ref 13.0–17.0)
MCH: 33.9 pg (ref 26.0–34.0)
MCHC: 35.4 g/dL (ref 30.0–36.0)
MCV: 96 fL (ref 78.0–100.0)
Platelets: 148 10*3/uL — ABNORMAL LOW (ref 150–400)
RBC: 3.27 MIL/uL — ABNORMAL LOW (ref 4.22–5.81)
RDW: 13.9 % (ref 11.5–15.5)
WBC: 17.7 10*3/uL — ABNORMAL HIGH (ref 4.0–10.5)

## 2017-08-21 LAB — MAGNESIUM: Magnesium: 1.9 mg/dL (ref 1.7–2.4)

## 2017-08-21 LAB — ANTI-SMOOTH MUSCLE ANTIBODY, IGG: F-ACTIN AB IGG: 10 U (ref 0–19)

## 2017-08-21 LAB — PROTIME-INR
INR: 1.71
PROTHROMBIN TIME: 20 s — AB (ref 11.4–15.2)

## 2017-08-21 LAB — TROPONIN I: TROPONIN I: 7.41 ng/mL — AB (ref <0.03)

## 2017-08-21 LAB — C4 COMPLEMENT: COMPLEMENT C4, BODY FLUID: 15 mg/dL (ref 14–44)

## 2017-08-21 LAB — ANTI-MICROSOMAL ANTIBODY LIVER / KIDNEY: LKM1 AB: 2 U (ref 0.0–20.0)

## 2017-08-21 MED ORDER — NICOTINE 21 MG/24HR TD PT24
21.0000 mg | MEDICATED_PATCH | Freq: Every day | TRANSDERMAL | Status: DC
  Administered 2017-08-21 – 2017-08-26 (×7): 21 mg via TRANSDERMAL
  Filled 2017-08-21 (×7): qty 1

## 2017-08-21 MED ORDER — ZOLPIDEM TARTRATE 5 MG PO TABS
5.0000 mg | ORAL_TABLET | Freq: Every evening | ORAL | Status: DC | PRN
  Administered 2017-08-21: 5 mg via ORAL
  Filled 2017-08-21 (×2): qty 1

## 2017-08-21 MED ORDER — FOLIC ACID 1 MG PO TABS
1.0000 mg | ORAL_TABLET | Freq: Every day | ORAL | Status: DC
  Administered 2017-08-21 – 2017-08-26 (×6): 1 mg via ORAL
  Filled 2017-08-21 (×6): qty 1

## 2017-08-21 MED ORDER — DIAZEPAM 5 MG PO TABS
5.0000 mg | ORAL_TABLET | Freq: Four times a day (QID) | ORAL | Status: DC | PRN
  Administered 2017-08-21 – 2017-08-25 (×5): 5 mg via ORAL
  Filled 2017-08-21 (×5): qty 1

## 2017-08-21 MED ORDER — CHLORHEXIDINE GLUCONATE CLOTH 2 % EX PADS
6.0000 | MEDICATED_PAD | Freq: Every day | CUTANEOUS | Status: DC
  Administered 2017-08-21 – 2017-08-25 (×3): 6 via TOPICAL

## 2017-08-21 NOTE — Progress Notes (Signed)
Progress Note  Patient Name: Seth Obrien Date of Encounter: 08/21/2017  Primary Cardiologist: new ([facility])  Subjective   Feels better. Breathing and edema much better. Tolerating CRRT. Has some spontaneous urine output. Still jaundiced.  Peaked and rapidly decreasing troponin level, pattern consistent with an acute event sometime on 08/18/2017 (Saturday). He does not recall loss of consciousness at that time, but had severe dyspnea that day.  Inpatient Medications    Scheduled Meds: . buprenorphine-naloxone  1 tablet Sublingual BID  . Chlorhexidine Gluconate Cloth  6 each Topical Daily  . Chlorhexidine Gluconate Cloth  6 each Topical Q0600  . folic acid  1 mg Oral Daily  . multivitamins with iron  1 tablet Oral Daily  . pantoprazole (PROTONIX) IV  40 mg Intravenous QHS  . sodium chloride flush  10-40 mL Intracatheter Q12H  . thiamine injection  100 mg Intravenous Daily   Continuous Infusions: . sodium chloride    . acetylcysteine (ACETADOTE) 40,000 mg in dextrose 5 % 800 mL infusion 11 mL/hr at 08/19/17 1953  . phenylephrine (NEO-SYNEPHRINE) Adult infusion Stopped (08/20/17 1714)  . dialysis replacement fluid (prismasate) 300 mL/hr at 08/21/17 0928  . dialysis replacement fluid (prismasate) 200 mL/hr at 08/20/17 1700  . dialysate (PRISMASATE) 750 mL/hr at 08/21/17 1203  . sodium chloride     PRN Meds: sodium chloride, fentaNYL (SUBLIMAZE) injection, heparin, heparin, ondansetron (ZOFRAN) IV, sodium chloride, sodium chloride flush   Vital Signs    Vitals:   08/21/17 1200 08/21/17 1215 08/21/17 1230 08/21/17 1245  BP: 115/77  120/76   Pulse: 98 100 95 97  Resp: (!) 24  Temp:      TempSrc:      SpO2: 99% 97% 97% 98%  Weight:      Height:        Intake/Output Summary (Last 24 hours) at 08/21/17 1309 Last data filed at 08/21/17 1300  Gross per 24 hour  Intake          1286.95 ml  Output             1751 ml  Net          -464.05 ml   Filed Weights   08/19/17 1416 08/20/17 1522 08/21/17 0106  Weight: 150 lb (68 kg) 179 lb 8 oz (81.4 kg) 175 lb 14.8 oz (79.8 kg)    Telemetry    STach - Personally Reviewed  ECG    No new tracing - Personally Reviewed  Physical Exam  Jaundice, calm, appears comfortable, but always fidgeting GEN: No acute distress.   Neck: No JVD Cardiac: RRR, no murmurs, rubs, or gallops.  Respiratory: Clear to auscultation bilaterally. GI: Soft, nontender, non-distended  MS: No edema; No deformity. Neuro:  Nonfocal  Psych: Normal affect   Labs    Chemistry Recent Labs Lab 08/19/17 1030  08/20/17 0322 08/20/17 1545 08/20/17 1720 08/20/17 2003 08/21/17 0350 08/21/17 0828  NA 113*  < > 120* 121* 121* 121* 123*  --   K 5.7*  < > 4.5 4.2 4.1  --  3.7  --   CL 69*  < > 72* 73* 74*  --  81*  --   CO2 <7*  < > --  25  --   GLUCOSE 107*  < > 110* 95 107*  --  112*  --   BUN 59*  < > 70* 76* 74*  --  68*  --   CREATININE 5.89*  < >  6.03* 6.20* 6.05*  --  4.90*  --   CALCIUM 9.9  < > 8.8* 8.1* 7.8*  --  7.9*  --   PROT 6.7  --  5.8*  --   --   --   --  5.1*  ALBUMIN 3.4*  --  2.8* 2.6* 2.5*  --  2.5* 2.5*  AST <5*  --  302*  --   --   --   --  271*  ALT <5*  --  107*  --   --   --   --  107*  ALKPHOS 60  --  49  --   --   --   --  50  BILITOT 8.8*  --  7.6*  --   --   --   --  6.9*  GFRNONAA 12*  < > 11* 11* 11*  --  14*  --   GFRAA 13*  < > 13* 13* 13*  --  17*  --   ANIONGAP  --   < > 24* 26* 23*  --  17*  --   < > = values in this interval not displayed.   Hematology Recent Labs Lab 08/19/17 1030 08/19/17 1240 08/20/17 1045 08/21/17 0350  WBC 15.5*  --  19.3* 17.7*  RBC 3.56*  --  3.63* 3.27*  HGB 12.2*  --  12.3* 11.1*  HCT 34.9*  --  34.7* 31.4*  MCV 98.0  --  95.6 96.0  MCH 34.3*  --  33.9 33.9  MCHC 35.0  --  35.4 35.4  RDW 13.4  --  13.8 13.9  PLT 348 317 258 148*    Cardiac Enzymes Recent Labs Lab 08/20/17 1045 08/20/17 1545 08/20/17 2003 08/21/17 0828    TROPONINI 19.34* 19.74* 14.07* 7.41*    Recent Labs Lab 08/19/17 1114  TROPIPOC 0.27*     BNP Recent Labs Lab 08/19/17 1030  BNP 3,252.8*     DDimer  Recent Labs Lab 08/19/17 1240  DDIMER 1.44*     Radiology    Dg Chest Port 1 View  Result Date: 08/21/2017 CLINICAL DATA:  Respiratory failure. EXAM: PORTABLE CHEST 1 VIEW COMPARISON:  08/20/2017. FINDINGS: Right IJ line, left subclavian line in stable position. Mediastinum hilar structures normal. Cardiomegaly with normal pulmonary vascularity. No focal infiltrate. No pleural effusion or pneumothorax IMPRESSION: 1. Lines tubes in stable position. 2. Stable cardiomegaly.  No pulmonary venous congestion . Electronically Signed   By: Maisie Fushomas  Register   On: 08/21/2017 07:22   Dg Chest Port 1 View  Result Date: 08/20/2017 CLINICAL DATA:  Encounter for hemodialysis EXAM: PORTABLE CHEST 1 VIEW COMPARISON:  08/19/2017 FINDINGS: Left subclavian central line remains in place with the tip in the SVC. Interval placement of right internal jugular Vas-Cath with the tip in the SVC. No pneumothorax. Heart is upper limits normal in size. Lungs are clear. No effusions. No acute bony abnormality. IMPRESSION: Right dialysis catheter tip in the SVC.  No pneumothorax. Electronically Signed   By: Charlett NoseKevin  Dover M.D.   On: 08/20/2017 15:00   Dg Chest Port 1 View  Result Date: 08/19/2017 CLINICAL DATA:  Patient status post central line placement. EXAM: PORTABLE CHEST 1 VIEW COMPARISON:  CT 08/19/2017 FINDINGS: Central venous catheter is present with tip projecting over the superior vena cava. Monitoring leads overlie the patient. Stable cardiac and mediastinal contours. No consolidative pulmonary opacities. No pleural effusion or pneumothorax. IMPRESSION: Central venous catheter tip projects over the superior vena cava.  Cardiomegaly. No acute cardiopulmonary process. Electronically Signed   By: Annia Belt M.D.   On: 08/19/2017 18:28   US Liver  Doppler  Result Date: 08/20/2017 CLINICAL DATA:  Acute liver failure. EXAM: DUPLEX ULTRASOUND OF LIVER TECHNIQUE: Color and duplex Doppler ultrasound was performed to evaluate the hepatic in-flow and out-flow vessels. COMPARISON:  None. FINDINGS: Portal Vein Velocities Main:  24.4 cm/sec Right:  16.8 cm/sec Left:  9.8 cm/sec Normal hepatopetal flow is noted in the portal veins. Hepatic Vein Velocities Right:  29.1 cm/sec Middle:  Not visualized. Left:  36.9 cm/sec Normal hepatofugal flow is noted in visualized hepatic veins. Hepatic Artery Velocity:  96.3 cm/sec Splenic Vein Velocity:  14.4 cm/sec Varices: Absent. Ascites: Ascites is noted. Spleen appears small.  Left pleural effusion is noted. IMPRESSION: There is no evidence of portal or splenic venous thrombosis or occlusion. Middle hepatic vein is not visualized and therefore thrombosis of this structure cannot be excluded. The right and left hepatic veins are patent and demonstrate normal flow. Mild ascites is noted. Electronically Signed   By: Lupita Raider, M.D.   On: 08/20/2017 12:41   US Abdomen Limited Ruq  Result Date: 08/20/2017 CLINICAL DATA:  Jaundice. EXAM: ULTRASOUND ABDOMEN LIMITED RIGHT UPPER QUADRANT COMPARISON:  CT chest, abdomen and pelvis August 19, 2017 FINDINGS: Gallbladder: Small amount of layering gallbladder sludge without wall thickening or pericholecystic fluid. No sonographic Murphy's sign elicited. Common bile duct: Diameter: 4 mm Liver: Mild hepatomegaly at 20.6 cm. The liver is diffusely echogenic with bidirectional portal flow, portal veins are patent. No intrahepatic masses or biliary dilatation. Trace perihepatic ascites. IMPRESSION: 1. Hepatomegaly with increased echotexture seen with hepatic steatosis/hepatocellular disease. 2. Patent portal vein with bidirectional flow, seen with cirrhosis/hepatic fibrosis and heart failure. 3. Gallbladder sludge without acute cholecystitis. Electronically Signed   By: Awilda Metro M.D.   On: 08/20/2017 01:26    Cardiac Studies   Echo 08/20/2017 - Left ventricle: The cavity size was normal. Wall thickness was   normal. Systolic function was normal. The estimated ejection   fraction was in the range of 60% to 65%. - Mitral valve: There was mild regurgitation. - Left atrium: The atrium was mildly dilated.   Patient Profile     31 y.o. male with multi system organ failure, dominated by acute liver failure, with anasarca onset about 2 weeks before severe acute decompensation that led to admission. Marked elevation in cardiac troponin, without associated major ECG changes and with normal LV wall motion and overall right and left ventricular systolic function.  Assessment & Plan    1. Troponin elevation:  The rapid rise and fall is consistent with an acute event that occurred last Saturday, but there is no corresponding ECG or echo abnormality. He does not remember an episode of syncope/collapse or severe chest pain. Strongly doubt true myocardial infarction, suspect the elevation in troponin is related to the multiple critical metabolic abnormalities.The pattern is not suggestive of myocarditis, which would be expected to cause a more sustained elevation. CHF symptoms and signs are rapidly resolving with CRRT volume removal. Will follow along. Recheck ECG. 2. Acute liver failure: appears to be the driving cause of the other problems. Pattern suggests chronic disease (edema and abdominal swelling preceded acute illness) with acute exacerbation (Toxic? Viral? Autoimmune?). 3. Acute oliguric renal failure:  Could also be explained by a transient hemodynamic injury. Some signs of improvement.  For questions or updates, please contact [facility] Please consult [email or web address] for contact  info under Cardiology/STEMI.      Signed, Thurmon Fair, MD  08/21/2017, 1:09 PM

## 2017-08-21 NOTE — Progress Notes (Signed)
Subjective: Minimal abdominal pain; much improved.  Objective: Vital signs in last 24 hours: Temp:  [97.6 F (36.4 C)-98.5 F (36.9 C)] 98.5 F (36.9 C) (09/11 0757) Pulse Rate:  [30-128] 94 (09/11 0945) Resp:  [8-25] 18 (09/11 0945) BP: (80-125)/(48-96) 125/68 (09/11 0930) SpO2:  [63 %-100 %] 100 % (09/11 0945) Weight:  [175 lb 14.8 oz (79.8 kg)-179 lb 8 oz (81.4 kg)] 175 lb 14.8 oz (79.8 kg) (09/11 0106) Weight change: 29 lb 8 oz (13.4 kg) Last BM Date: 08/20/17  PE: GEN:  Jaundiced, alert, no encephalopathy  Lab Results: CBC    Component Value Date/Time   WBC 17.7 (H) 08/21/2017 0350   RBC 3.27 (L) 08/21/2017 0350   HGB 11.1 (L) 08/21/2017 0350   HCT 31.4 (L) 08/21/2017 0350   PLT 148 (L) 08/21/2017 0350   MCV 96.0 08/21/2017 0350   MCH 33.9 08/21/2017 0350   MCHC 35.4 08/21/2017 0350   RDW 13.9 08/21/2017 0350   LYMPHSABS 1.3 08/19/2017 1030   MONOABS 1.5 (H) 08/19/2017 1030   EOSABS 0.0 08/19/2017 1030   BASOSABS 0.0 08/19/2017 1030   CMP     Component Value Date/Time   NA 123 (L) 08/21/2017 0350   K 3.7 08/21/2017 0350   CL 81 (L) 08/21/2017 0350   CO2 25 08/21/2017 0350   GLUCOSE 112 (H) 08/21/2017 0350   BUN 68 (H) 08/21/2017 0350   CREATININE 4.90 (H) 08/21/2017 0350   CALCIUM 7.9 (L) 08/21/2017 0350   PROT 5.8 (L) 08/20/2017 0322   ALBUMIN 2.5 (L) 08/21/2017 0350   AST 302 (H) 08/20/2017 0322   ALT 107 (H) 08/20/2017 0322   ALKPHOS 49 08/20/2017 0322   BILITOT 7.6 (H) 08/20/2017 0322   GFRNONAA 14 (L) 08/21/2017 0350   GFRAA 17 (L) 08/21/2017 0350   INR 1.71; acute hepatitis panel negative; autoimmune studies pending  Assessment:  1.  Elevated LFTs, downtrending but with synthetic dysfusion, with coagulopathy. INR is improving (synthetic improvement with help from VitK both likely).  EtOH hepatitis versus autoimmune process versus other. 2.  Acute kidney injury, hyponatremia. Some urine output.  Plan:  1.  Awaiting autoimmune studies. 2.   Follow LFTs (today's pending) and INR daily. 3.  On CVVHD. 4.  Pending serologies, may need liver biopsy to help clarify liver issue. 5.  In face of AKI, can't do contrasted study to help definitively exclude middle HV clot, but this seems less likely in presence of hepatic steatosis on U/S and lack of caudate lobe hypertrophy. 6.  Will follow.   Seth Obrien,Seth Obrien 08/21/2017, 9:57 AM   Cell [phone] If no answer or after 5 PM call [phone]

## 2017-08-21 NOTE — Progress Notes (Signed)
eLink Physician-Brief Progress Note Patient Name: Seth HoyerCody Rennels DOB: 12/25/1985 MRN: [id number]   Date of Service  08/21/2017  HPI/Events of Note  Wants sleep aid and nicotine patch  eICU Interventions  meds ordered        Erin FullingKurian Wonder Donaway 08/21/2017, 3:39 PM

## 2017-08-21 NOTE — Progress Notes (Signed)
Ambien made patient hallucinate, paging CCM to discontinue. Will continue to monitor closely. Tamsen MeekLewis, Dajanae Brophy E, RN 08/21/2017 4:44 PM

## 2017-08-21 NOTE — Progress Notes (Signed)
PULMONARY / CRITICAL CARE MEDICINE   Name: Dara HoyerCody Robello MRN: [id number] DOB: 12/31/1985    ADMISSION DATE:  08/19/2017  CHIEF COMPLAINT:  Abdominal distention, jaundice, and dyspnea.  HISTORY OF PRESENT ILLNESS:   This is a 31 year old who normally enjoys normal active health. Approximately 2 weeks ago he started having difficulties with lower extremity swelling and in more recent days abdominal swelling which makes it so dispensed don't fit. He has noticed jaundice and fatigue, nausea and vomiting. Noted to be in liver, renal failure with elevated LA, metabolic acidosis and troponin. Admitted to ICU for management.   Patient he said he ate some mushrooms from his yard about 1.5 months ago. No new meds or diet. Travel to [place] in [place]. No other recent travel. Drinks 3-4 hard ciders/day  He has a history of opiate abuse (on suboxone), but denies ever using needles in the past or having history of exposure to hepatitis.  PAST MEDICAL HISTORY :  He  has a past medical history of Eczema and Heartburn.  PAST SURGICAL HISTORY: He  has no past surgical history on file.  No Known Allergies   His been taking Suboxone under supervision for the past 2 years  No current facility-administered medications on file prior to encounter.    No current outpatient prescriptions on file prior to encounter.    FAMILY HISTORY:  He has no family history of early liver disease. He is not aware of any problems with copper metabolism and has never heard of the words Wilson's disease.   SOCIAL HISTORY: He drinks alcohol as noted. He smokes 1/2-1 pack of cigarettes per day. Reports no use of street drugs for the past 2 years and denies ever having used needles. 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  REVIEW OF SYSTEMS:   All negative; except for those that are bolded, which indicate positives.  Constitutional: weight loss,  weight gain, night sweats, fevers, chills, fatigue, weakness.  HEENT: headaches, sore throat, sneezing, nasal congestion, post nasal drip, difficulty swallowing, tooth/dental problems, visual complaints, visual changes, ear aches. Neuro: difficulty with speech, weakness, numbness, ataxia. CV:  chest pain, orthopnea, PND, swelling in lower extremities, dizziness, palpitations, syncope.  Resp: cough, hemoptysis, dyspnea, wheezing. GI: heartburn, indigestion, abdominal pain, nausea, vomiting, diarrhea, constipation, change in bowel habits, loss of appetite, hematemesis, melena, hematochezia.  GU: dysuria, change in color of urine, urgency or frequency, flank pain, hematuria. MSK: joint pain or swelling, decreased range of motion. Psych: change in mood or affect, depression, anxiety, suicidal ideations, homicidal ideations. Skin: rash, itching, bruising.  SUBJECTIVE:  Started on CVVH Continues on NAC infusion Making some urine  VITAL SIGNS: BP 123/64 (BP Location: Left Arm)   Pulse 94   Temp 98.5 F (36.9 C) (Oral)   Resp 17   Ht 5\' 10"  (1.778 m)   Wt 175 lb 14.8 oz (79.8 kg)   SpO2 97%   BMI 25.24 kg/m   HEMODYNAMICS: CVP:  [3 mmHg-9 mmHg] 7 mmHg  VENTILATOR SETTINGS:    INTAKE / OUTPUT: I/O last 3 completed shifts: In: 3606.7 [I.V.:3476.7; Other:130] Out: 1484 [Urine:710; Other:774]  PHYSICAL EXAMINATION: Gen:      No acute distress HEENT:  EOMI, sclera anicteric Neck:     No masses; no thyromegaly Lungs:    Clear to auscultation bilaterally; normal respiratory effort CV:         Regular rate and rhythm; no  murmurs Abd:      + bowel sounds; soft, non-tender; no palpable masses, no distension Ext:    No edema; adequate peripheral perfusion Skin:      Warm and dry; no rash Neuro: alert and oriented x 3 Psych: normal mood and affect  LABS:  BMET  Recent Labs Lab 08/20/17 1545 08/20/17 1720 08/20/17 2003 08/21/17 0350  NA 121* 121* 121* 123*  K 4.2 4.1  --  3.7   CL 73* 74*  --  81*  CO2 22 24  --  25  BUN 76* 74*  --  68*  CREATININE 6.20* 6.05*  --  4.90*  GLUCOSE 95 107*  --  112*    Electrolytes  Recent Labs Lab 08/19/17 1421  08/20/17 0322 08/20/17 1545 08/20/17 1720 08/21/17 0350  CALCIUM 9.6  < > 8.8* 8.1* 7.8* 7.9*  MG 2.0  --  1.7  --   --  1.9  PHOS 8.7*  --  6.0* 6.4* 6.3* 5.2*  < > = values in this interval not displayed.  CBC  Recent Labs Lab 08/19/17 1030 08/19/17 1240 08/20/17 1045 08/21/17 0350  WBC 15.5*  --  19.3* 17.7*  HGB 12.2*  --  12.3* 11.1*  HCT 34.9*  --  34.7* 31.4*  PLT 348 317 258 148*    Coag's  Recent Labs Lab 08/19/17 1030 08/19/17 1240 08/20/17 0322  APTT 40* 41*  --   INR 2.42 2.53 2.24    Sepsis Markers  Recent Labs Lab 08/19/17 1404 08/20/17 1024 08/20/17 1311  LATICACIDVEN >17.00* 2.6* 2.1*    ABG  Recent Labs Lab 08/19/17 1130 08/19/17 1700 08/20/17 0415  PHART 7.410 7.537* 7.539*  PCO2ART CRITICAL RESULT CALLED TO, READ BACK BY AND VERIFIED WITH: 16.8* 29.3*  PO2ART 275* 109.0* 100    Liver Enzymes  Recent Labs Lab 08/19/17 1030 08/20/17 0322 08/20/17 1545 08/20/17 1720 08/21/17 0350  AST <5* 302*  --   --   --   ALT <5* 107*  --   --   --   ALKPHOS 60 49  --   --   --   BILITOT 8.8* 7.6*  --   --   --   ALBUMIN 3.4* 2.8* 2.6* 2.5* 2.5*    Cardiac Enzymes  Recent Labs Lab 08/20/17 1045 08/20/17 1545 08/20/17 2003  TROPONINI 19.34* 19.74* 14.07*    Glucose  Recent Labs Lab 08/20/17 1137 08/20/17 1551 08/20/17 2024 08/20/17 2344 08/21/17 0321 08/21/17 0756  GLUCAP 125* 112* 97 134* 108* 112*    Imaging   STUDIES:  Right upper quadrant ultrasound with Doppler to evaluate portal flow is pending  CULTURES:  ANTIBIOTICS:  SIGNIFICANT EVENTS:   LINES/TUBES: Lt [place] CVL> 9/9 HD cath 9/10 >  ASSESSMENT / PLAN: Acute hepatic, renal failure of unclear etiology Associated with lactic acidosis, shock  Acute liver failure ?  Mushroom toxicity, alcohol hepatitis Normal tylenol levels, autoimmune work up [place] pending Continue NAC till tomorrow Follow hepatitis panel Vit K, Follow LFTs  Renal failure With acidosis Started on CVVH. Monitor urine output and cr  Chest pain Elevated troponin > trending dowm Tele monitoring Follow troponin  The patient is critically ill with multiple organ system failure and requires high complexity decision making for assessment and support, frequent evaluation and titration of therapies, advanced monitoring, review of radiographic studies and interpretation of complex data.   Critical Care Time devoted to patient care services, exclusive of separately billable procedures, described in this  note is 35 minutes.   Chilton Greathouse MD [facility] Pager [phone] If no answer or after 3pm call: [phone] 08/21/2017, 8:16 AM

## 2017-08-21 NOTE — Progress Notes (Signed)
[facility] Progress Note    Assessment/ Plan:   1.  Acute kidney injury: no prior history of kidney issues.  I suspect this may be all related to his acute liver injury but will get ANA, ANCA, complements (note that we will have to interpret these in the setting of other values as these are hepatically synthesized) and a UP/C ratio to rule out systemic autoimmune causes of illness which could explain both.  Also getting anti-smooth muscle and anti-microsomal LK as well.  started CRRT yesteday, some UOP but still quite vol overloaded.  Will be a little more aggressive with CRRT prescription today with serial sodium checks.  No obstruction on imaging.  2.  Acute liver injury: unclear etiology so far, ? Alcoholic hepatitis based on echogenicity of liver.  Hepatitis panel and HIV negative  Other viral causes (CMV, EBV, tick-borne illnesses) could be considered but these are less likely.  Liver US with doppler without clot but some vessels not optimally visualized.  Tylenol level negative.  On NAC.  GI following  3.  Metabolic acidosis: off bicarb gtt.  Likely from lactate of 17.  Unable to calculate compensation from initial values, resolving  4.  Hyponatremia: resolving, CRRT dose as described above  5.  Hyperkalemia: resolved with CRRT  6.  Troponemia: cardiology following, TTE without WMA.  Troponins 19--> 14 this AM.  Subjective:    Started on CRRT yesterday without incident.  Pt reports feeling better this AM.     Objective:   BP 124/72   Pulse 93   Temp 98.5 F (36.9 C) (Oral)   Resp 16   Ht  (1.778 m)   Wt 79.8 kg (175 lb 14.8 oz)   SpO2 99%   BMI 25.24 kg/m   Intake/Output Summary (Last 24 hours) at 08/21/17 0934 Last data filed at 08/21/17 0900  Gross per 24 hour  Intake           1253.8 ml  Output             1593 ml  Net           -339.2 ml   Weight change: 13.4 kg (29 lb 8 oz)  Physical Exam: GEN anxious-appearing, NAD HEENT EOMI, PERRL,  sclerae icteric NECK R IJ nontunneled catheter PULM clear anteriorly, muffled in bases CV tachycardic, soft systolic murmur ABD mild diffuse tenderness, + hepatomegaly EXT 2+ LE edema NEURO AAO x3, some tremulousness SKIN mild jaundice  Imaging: Ct Abdomen Pelvis Wo Contrast  Result Date: 08/19/2017 CLINICAL DATA:  New onset jaundice to sclera, swelling ankles. Shortness of breath. EXAM: CT CHEST, ABDOMEN AND PELVIS WITHOUT CONTRAST TECHNIQUE: Multidetector CT imaging of the chest, abdomen and pelvis was performed following the standard protocol without IV contrast. COMPARISON:  None. FINDINGS: CT CHEST FINDINGS Cardiovascular: Heart size is upper normal. No pericardial effusion. No thoracic aortic aneurysm. Mediastinum/Nodes: No mass or enlarged lymph nodes seen within the mediastinum or perihilar regions. Esophagus is unremarkable. Trachea and central bronchi are unremarkable. Lungs/Pleura: Small right pleural effusion. Lungs are otherwise clear. No pneumonia or pulmonary edema. No pneumothorax. Musculoskeletal: No acute or significant osseous findings. CT ABDOMEN PELVIS FINDINGS Hepatobiliary: Liver is diffusely/markedly low in density suggesting fatty infiltration, alternatively edematous related to hepatitis. No focal liver mass or lesion seen. Gallbladder is unremarkable.  No bile duct dilatation. Pancreas: Unremarkable. No pancreatic ductal dilatation or surrounding inflammatory changes. Spleen: Normal in size without focal abnormality. Adrenals/Urinary Tract: Adrenal glands are unremarkable. Kidneys  are unremarkable without mass, stone or hydronephrosis. Bladder is decompressed. Stomach/Bowel: Bowel is normal in caliber. No bowel wall thickening or evidence of bowel wall inflammation. Vascular/Lymphatic: No significant vascular findings are present. No enlarged abdominal or pelvic lymph nodes. Reproductive: Prostate is unremarkable. Other: Small amount of free fluid and fluid stranding within the  lower pelvis. No circumscribed fluid collection or abscess like collection. No free fluid appreciated within the upper abdomen. Musculoskeletal: Ill-defined fluid/edema throughout the superficial soft tissues indicating anasarca. Associated edematous scrotal wall thickening. No acute or significant osseous finding. IMPRESSION: 1. Liver is diffusely/markedly low in density suggesting either fatty infiltration or edema related to hepatitis or other infectious/inflammatory process. There is associated hepatomegaly (right liver lobe measures 21 cm length). No focal mass or lesion appreciated within the liver. 2. Gallbladder is unremarkable.  No bile duct dilatation. 3. Anasarca. 4. Scrotal wall thickening (edematous), likely related to the anasarca. 5. Small amount of free fluid/fluid stranding in the lower pelvis. No abscess collection. 6. Small right pleural effusion. Lungs otherwise clear. No pneumonia or pulmonary edema. Electronically Signed   By: Bary Richard M.D.   On: 08/19/2017 12:17   Ct Chest Wo Contrast  Result Date: 08/19/2017 CLINICAL DATA:  New onset jaundice to sclera, swelling ankles. Shortness of breath. EXAM: CT CHEST, ABDOMEN AND PELVIS WITHOUT CONTRAST TECHNIQUE: Multidetector CT imaging of the chest, abdomen and pelvis was performed following the standard protocol without IV contrast. COMPARISON:  None. FINDINGS: CT CHEST FINDINGS Cardiovascular: Heart size is upper normal. No pericardial effusion. No thoracic aortic aneurysm. Mediastinum/Nodes: No mass or enlarged lymph nodes seen within the mediastinum or perihilar regions. Esophagus is unremarkable. Trachea and central bronchi are unremarkable. Lungs/Pleura: Small right pleural effusion. Lungs are otherwise clear. No pneumonia or pulmonary edema. No pneumothorax. Musculoskeletal: No acute or significant osseous findings. CT ABDOMEN PELVIS FINDINGS Hepatobiliary: Liver is diffusely/markedly low in density suggesting fatty infiltration,  alternatively edematous related to hepatitis. No focal liver mass or lesion seen. Gallbladder is unremarkable.  No bile duct dilatation. Pancreas: Unremarkable. No pancreatic ductal dilatation or surrounding inflammatory changes. Spleen: Normal in size without focal abnormality. Adrenals/Urinary Tract: Adrenal glands are unremarkable. Kidneys are unremarkable without mass, stone or hydronephrosis. Bladder is decompressed. Stomach/Bowel: Bowel is normal in caliber. No bowel wall thickening or evidence of bowel wall inflammation. Vascular/Lymphatic: No significant vascular findings are present. No enlarged abdominal or pelvic lymph nodes. Reproductive: Prostate is unremarkable. Other: Small amount of free fluid and fluid stranding within the lower pelvis. No circumscribed fluid collection or abscess like collection. No free fluid appreciated within the upper abdomen. Musculoskeletal: Ill-defined fluid/edema throughout the superficial soft tissues indicating anasarca. Associated edematous scrotal wall thickening. No acute or significant osseous finding. IMPRESSION: 1. Liver is diffusely/markedly low in density suggesting either fatty infiltration or edema related to hepatitis or other infectious/inflammatory process. There is associated hepatomegaly (right liver lobe measures 21 cm length). No focal mass or lesion appreciated within the liver. 2. Gallbladder is unremarkable.  No bile duct dilatation. 3. Anasarca. 4. Scrotal wall thickening (edematous), likely related to the anasarca. 5. Small amount of free fluid/fluid stranding in the lower pelvis. No abscess collection. 6. Small right pleural effusion. Lungs otherwise clear. No pneumonia or pulmonary edema. Electronically Signed   By: Bary Richard M.D.   On: 08/19/2017 12:17   Dg Chest Port 1 View  Result Date: 08/21/2017 CLINICAL DATA:  Respiratory failure. EXAM: PORTABLE CHEST 1 VIEW COMPARISON:  08/20/2017. FINDINGS: Right IJ line, left subclavian  line in  stable position. Mediastinum hilar structures normal. Cardiomegaly with normal pulmonary vascularity. No focal infiltrate. No pleural effusion or pneumothorax IMPRESSION: 1. Lines tubes in stable position. 2. Stable cardiomegaly.  No pulmonary venous congestion . Electronically Signed   By: Maisie Fushomas  Register   On: 08/21/2017 07:22   Dg Chest Port 1 View  Result Date: 08/20/2017 CLINICAL DATA:  Encounter for hemodialysis EXAM: PORTABLE CHEST 1 VIEW COMPARISON:  08/19/2017 FINDINGS: Left subclavian central line remains in place with the tip in the SVC. Interval placement of right internal jugular Vas-Cath with the tip in the SVC. No pneumothorax. Heart is upper limits normal in size. Lungs are clear. No effusions. No acute bony abnormality. IMPRESSION: Right dialysis catheter tip in the SVC.  No pneumothorax. Electronically Signed   By: Charlett NoseKevin  Dover M.D.   On: 08/20/2017 15:00   Dg Chest Port 1 View  Result Date: 08/19/2017 CLINICAL DATA:  Patient status post central line placement. EXAM: PORTABLE CHEST 1 VIEW COMPARISON:  CT 08/19/2017 FINDINGS: Central venous catheter is present with tip projecting over the superior vena cava. Monitoring leads overlie the patient. Stable cardiac and mediastinal contours. No consolidative pulmonary opacities. No pleural effusion or pneumothorax. IMPRESSION: Central venous catheter tip projects over the superior vena cava. Cardiomegaly. No acute cardiopulmonary process. Electronically Signed   By: Annia Beltrew  Davis M.D.   On: 08/19/2017 18:28   Dg Chest Portable 1 View  Result Date: 08/19/2017 CLINICAL DATA:  Patient with recent onset jaundice. EXAM: PORTABLE CHEST 1 VIEW COMPARISON:  None. FINDINGS: Monitoring leads overlie the patient. Cardiac contours upper limits of normal. No consolidative pulmonary opacities. No pleural effusion or pneumothorax. IMPRESSION: Mild cardiomegaly.  No acute cardiopulmonary process. Electronically Signed   By: Annia Beltrew  Davis M.D.   On: 08/19/2017  10:54   [place] Liver Doppler  Result Date: 08/20/2017 CLINICAL DATA:  Acute liver failure. EXAM: DUPLEX ULTRASOUND OF LIVER TECHNIQUE: Color and duplex Doppler ultrasound was performed to evaluate the hepatic in-flow and out-flow vessels. COMPARISON:  None. FINDINGS: Portal Vein Velocities Main:  24.4 cm/sec Right:  16.8 cm/sec Left:  9.8 cm/sec Normal hepatopetal flow is noted in the portal veins. Hepatic Vein Velocities Right:  29.1 cm/sec Middle:  Not visualized. Left:  36.9 cm/sec Normal hepatofugal flow is noted in visualized hepatic veins. Hepatic Artery Velocity:  96.3 cm/sec Splenic Vein Velocity:  14.4 cm/sec Varices: Absent. Ascites: Ascites is noted. Spleen appears small.  Left pleural effusion is noted. IMPRESSION: There is no evidence of portal or splenic venous thrombosis or occlusion. Middle hepatic vein is not visualized and therefore thrombosis of this structure cannot be excluded. The right and left hepatic veins are patent and demonstrate normal flow. Mild ascites is noted. Electronically Signed   By: Lupita RaiderJames  Green Jr, M.D.   On: 08/20/2017 12:41   [place] Abdomen Limited Ruq  Result Date: 08/20/2017 CLINICAL DATA:  Jaundice. EXAM: ULTRASOUND ABDOMEN LIMITED RIGHT UPPER QUADRANT COMPARISON:  CT chest, abdomen and pelvis August 19, 2017 FINDINGS: Gallbladder: Small amount of layering gallbladder sludge without wall thickening or pericholecystic fluid. No sonographic Murphy's sign elicited. Common bile duct: Diameter: 4 mm Liver: Mild hepatomegaly at 20.6 cm. The liver is diffusely echogenic with bidirectional portal flow, portal veins are patent. No intrahepatic masses or biliary dilatation. Trace perihepatic ascites. IMPRESSION: 1. Hepatomegaly with increased echotexture seen with hepatic steatosis/hepatocellular disease. 2. Patent portal vein with bidirectional flow, seen with cirrhosis/hepatic fibrosis and heart failure. 3. Gallbladder sludge without acute cholecystitis. Electronically Signed  By: Awilda Metro M.D.   On: 08/20/2017 01:26    Labs: BMET  Recent Labs Lab 08/19/17 1030 08/19/17 1421 08/19/17 2059 08/20/17 0322 08/20/17 1545 08/20/17 1720 08/20/17 2003 08/21/17 0350  NA 113* 112* 117* 120* 121* 121* 121* 123*  K 5.7* 6.3* 4.5 4.5 4.2 4.1  --  3.7  CL 69* 68* 70* 72* 73* 74*  --  81*  CO2 <7* 8* --  25  GLUCOSE 107* 122* 162* 110* 95 107*  --  112*  BUN 59* 62* 64* 70* 76* 74*  --  68*  CREATININE 5.89* 5.90* 5.72* 6.03* 6.20* 6.05*  --  4.90*  CALCIUM 9.9 9.6 8.8* 8.8* 8.1* 7.8*  --  7.9*  PHOS  --  8.7*  --  6.0* 6.4* 6.3*  --  5.2*   CBC  Recent Labs Lab 08/19/17 1030 08/19/17 1240 08/20/17 1045 08/21/17 0350  WBC 15.5*  --  19.3* 17.7*  NEUTROABS 12.8*  --   --   --   HGB 12.2*  --  12.3* 11.1*  HCT 34.9*  --  34.7* 31.4*  MCV 98.0  --  95.6 96.0  PLT 348 317 258 148*    Medications:    . buprenorphine-naloxone  1 tablet Sublingual BID  . Chlorhexidine Gluconate Cloth  6 each Topical Daily  . Chlorhexidine Gluconate Cloth  6 each Topical Q0600  . multivitamins with iron  1 tablet Oral Daily  . pantoprazole (PROTONIX) IV  40 mg Intravenous QHS  . sodium chloride flush  10-40 mL Intracatheter Q12H  . thiamine injection  100 mg Intravenous Daily      Bufford Buttner MD [facility] pgr [phone] 08/21/2017, 9:34 AM

## 2017-08-22 LAB — MAGNESIUM: Magnesium: 2.1 mg/dL (ref 1.7–2.4)

## 2017-08-22 LAB — HEPATIC FUNCTION PANEL
ALT: 102 U/L — ABNORMAL HIGH (ref 17–63)
AST: 217 U/L — ABNORMAL HIGH (ref 15–41)
Albumin: 2.3 g/dL — ABNORMAL LOW (ref 3.5–5.0)
Alkaline Phosphatase: 61 U/L (ref 38–126)
Bilirubin, Direct: 3.2 mg/dL — ABNORMAL HIGH (ref 0.1–0.5)
Indirect Bilirubin: 2.8 mg/dL — ABNORMAL HIGH (ref 0.3–0.9)
Total Bilirubin: 6 mg/dL — ABNORMAL HIGH (ref 0.3–1.2)
Total Protein: 5 g/dL — ABNORMAL LOW (ref 6.5–8.1)

## 2017-08-22 LAB — RENAL FUNCTION PANEL
Albumin: 2.3 g/dL — ABNORMAL LOW (ref 3.5–5.0)
Albumin: 2.4 g/dL — ABNORMAL LOW (ref 3.5–5.0)
Anion gap: 9 (ref 5–15)
Anion gap: 9 (ref 5–15)
BUN: 42 mg/dL — ABNORMAL HIGH (ref 6–20)
BUN: 37 mg/dL — ABNORMAL HIGH (ref 6–20)
CO2: 28 mmol/L (ref 22–32)
CO2: 28 mmol/L (ref 22–32)
Calcium: 7.8 mg/dL — ABNORMAL LOW (ref 8.9–10.3)
Calcium: 8.1 mg/dL — ABNORMAL LOW (ref 8.9–10.3)
Chloride: 92 mmol/L — ABNORMAL LOW (ref 101–111)
Chloride: 92 mmol/L — ABNORMAL LOW (ref 101–111)
Creatinine, Ser: 2.11 mg/dL — ABNORMAL HIGH (ref 0.61–1.24)
Creatinine, Ser: 1.67 mg/dL — ABNORMAL HIGH (ref 0.61–1.24)
GFR calc Af Amer: 46 mL/min — ABNORMAL LOW
GFR calc Af Amer: >60 mL/min
GFR calc non Af Amer: 40 mL/min — ABNORMAL LOW
GFR calc non Af Amer: 53 mL/min — ABNORMAL LOW
Glucose, Bld: 138 mg/dL — ABNORMAL HIGH (ref 65–99)
Glucose, Bld: 159 mg/dL — ABNORMAL HIGH (ref 65–99)
Phosphorus: 3.5 mg/dL (ref 2.5–4.6)
Phosphorus: 2.7 mg/dL (ref 2.5–4.6)
Potassium: 3.1 mmol/L — ABNORMAL LOW (ref 3.5–5.1)
Potassium: 3.1 mmol/L — ABNORMAL LOW (ref 3.5–5.1)
Sodium: 129 mmol/L — ABNORMAL LOW (ref 135–145)
Sodium: 129 mmol/L — ABNORMAL LOW (ref 135–145)

## 2017-08-22 LAB — CBC
HCT: 33.9 % — ABNORMAL LOW (ref 39.0–52.0)
HEMOGLOBIN: 11.5 g/dL — AB (ref 13.0–17.0)
MCH: 33.1 pg (ref 26.0–34.0)
MCHC: 33.9 g/dL (ref 30.0–36.0)
MCV: 97.7 fL (ref 78.0–100.0)
PLATELETS: 75 10*3/uL — AB (ref 150–400)
RBC: 3.47 MIL/uL — AB (ref 4.22–5.81)
RDW: 14.5 % (ref 11.5–15.5)
WBC: 13.2 10*3/uL — ABNORMAL HIGH (ref 4.0–10.5)

## 2017-08-22 LAB — GLUCOSE, CAPILLARY
GLUCOSE-CAPILLARY: 168 mg/dL — AB (ref 65–99)
Glucose-Capillary: 140 mg/dL — ABNORMAL HIGH (ref 65–99)

## 2017-08-22 LAB — PROTIME-INR
INR: 1.37
Prothrombin Time: 16.8 seconds — ABNORMAL HIGH (ref 11.4–15.2)

## 2017-08-22 MED ORDER — BENZOCAINE 10 % MT GEL
Freq: Three times a day (TID) | OROMUCOSAL | Status: DC | PRN
  Filled 2017-08-22: qty 9

## 2017-08-22 MED ORDER — MAGIC MOUTHWASH
5.0000 mL | Freq: Three times a day (TID) | ORAL | Status: DC | PRN
  Administered 2017-08-22 – 2017-08-23 (×2): 5 mL via ORAL
  Filled 2017-08-22 (×3): qty 5

## 2017-08-22 MED ORDER — POTASSIUM CHLORIDE CRYS ER 20 MEQ PO TBCR
40.0000 meq | EXTENDED_RELEASE_TABLET | Freq: Two times a day (BID) | ORAL | Status: AC
  Administered 2017-08-22 (×2): 40 meq via ORAL
  Filled 2017-08-22 (×2): qty 2

## 2017-08-22 MED ORDER — HEPARIN SODIUM (PORCINE) 5000 UNIT/ML IJ SOLN
5000.0000 [IU] | Freq: Three times a day (TID) | INTRAMUSCULAR | Status: DC
  Administered 2017-08-22 – 2017-08-23 (×3): 5000 [IU] via SUBCUTANEOUS
  Filled 2017-08-22 (×4): qty 1

## 2017-08-22 MED ORDER — GI COCKTAIL ~~LOC~~
30.0000 mL | Freq: Three times a day (TID) | ORAL | Status: DC | PRN
  Administered 2017-08-22 – 2017-08-23 (×2): 30 mL via ORAL
  Filled 2017-08-22 (×3): qty 30

## 2017-08-22 MED ORDER — PANTOPRAZOLE SODIUM 40 MG PO TBEC
40.0000 mg | DELAYED_RELEASE_TABLET | Freq: Two times a day (BID) | ORAL | Status: DC
  Administered 2017-08-22 – 2017-08-26 (×9): 40 mg via ORAL
  Filled 2017-08-22 (×9): qty 1

## 2017-08-22 NOTE — Progress Notes (Addendum)
PULMONARY / CRITICAL CARE MEDICINE   Name: Seth HoyerCody Obrien MRN: [id number] DOB: 09/17/1986    ADMISSION DATE:  08/19/2017  CHIEF COMPLAINT:  Abdominal distention, jaundice, and dyspnea.  HISTORY OF PRESENT ILLNESS:   This is a 31 year old who normally enjoys normal active health. Approximately 2 weeks ago he started having difficulties with lower extremity swelling and in more recent days abdominal swelling which makes it so dispensed don't fit. He has noticed jaundice and fatigue, nausea and vomiting. Noted to be in liver, renal failure with elevated LA, metabolic acidosis and troponin. Admitted to ICU for management.   Patient he said he ate some mushrooms from his yard about 1.5 months ago. No new meds or diet. Travel to [place] in [place]. No other recent travel. Drinks 3-4 hard ciders/day  He has a history of opiate abuse (on suboxone), but denies ever using needles in the past or having history of exposure to hepatitis.  PAST MEDICAL HISTORY :  He  has a past medical history of Eczema and Heartburn.  PAST SURGICAL HISTORY: He  has no past surgical history on file.  No Known Allergies   His been taking Suboxone under supervision for the past 2 years  No current facility-administered medications on file prior to encounter.    No current outpatient prescriptions on file prior to encounter.    FAMILY HISTORY:  He has no family history of early liver disease. He is not aware of any problems with copper metabolism and has never heard of the words Wilson's disease.   SOCIAL HISTORY: He drinks alcohol as noted. He smokes 1/2-1 pack of cigarettes per day. Reports no use of street drugs for the past 2 years and denies ever having used needles. 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  REVIEW OF SYSTEMS:   All negative; except for those that are bolded, which indicate positives.  Constitutional: weight loss,  weight gain, night sweats, fevers, chills, fatigue, weakness.  HEENT: headaches, sore throat, sneezing, nasal congestion, post nasal drip, difficulty swallowing, tooth/dental problems, visual complaints, visual changes, ear aches. Neuro: difficulty with speech, weakness, numbness, ataxia. CV:  chest pain, orthopnea, PND, swelling in lower extremities, dizziness, palpitations, syncope.  Resp: cough, hemoptysis, dyspnea, wheezing. GI: heartburn, indigestion, abdominal pain, nausea, vomiting, diarrhea, constipation, change in bowel habits, loss of appetite, hematemesis, melena, hematochezia.  GU: dysuria, change in color of urine, urgency or frequency, flank pain, hematuria. MSK: joint pain or swelling, decreased range of motion. Psych: change in mood or affect, depression, anxiety, suicidal ideations, homicidal ideations. Skin: rash, itching, bruising.  SUBJECTIVE:  Feels better Off CVVH today AM Making some urine  VITAL SIGNS: BP 137/90 (BP Location: Left Arm)   Pulse 89   Temp 97.9 F (36.6 C) (Oral)   Resp 14   Ht 5\' 10"  (1.778 m)   Wt 175 lb 14.8 oz (79.8 kg)   SpO2 97%   BMI 25.24 kg/m   HEMODYNAMICS: CVP:  [0 mmHg-8 mmHg] 0 mmHg  VENTILATOR SETTINGS:    INTAKE / OUTPUT: I/O last 3 completed shifts: In: 1956 [P.O.:1200; I.V.:636; Other:120] Out: 4642 [Urine:1300; Other:3342]  PHYSICAL EXAMINATION: Blood pressure 137/90, pulse 89, temperature 97.9 F (36.6 C), temperature source Oral, resp. rate 14, height 5\' 10"  (1.778 m), weight 175 lb 14.8 oz (79.8 kg), SpO2 97 %. Gen:      No acute distress HEENT:  EOMI, sclera icteric Neck:  No masses; no thyromegaly Lungs:    Clear to auscultation bilaterally; normal respiratory effort CV:         Regular rate and rhythm; no murmurs Abd:      + bowel sounds; soft, non-tender; no palpable masses, no distension Ext:    No edema; adequate peripheral perfusion Skin:      Warm and dry; no rash Neuro: alert and oriented x  3 Psych: normal mood and affect  LABS:  BMET  Recent Labs Lab 08/21/17 0350 08/21/17 1230 08/21/17 1619 08/22/17 0402  NA 123* 125* 127* 129*  K 3.7  --  3.4* 3.1*  CL 81*  --  86* 92*  CO2 25  --  26 28  BUN 68*  --  56* 42*  CREATININE 4.90*  --  3.39* 2.11*  GLUCOSE 112*  --  132* 138*    Electrolytes  Recent Labs Lab 08/20/17 0322  08/21/17 0350 08/21/17 1619 08/22/17 0402  CALCIUM 8.8*  < > 7.9* 8.0* 7.8*  MG 1.7  --  1.9  --  2.1  PHOS 6.0*  < > 5.2* 4.3 3.5  < > = values in this interval not displayed.  CBC  Recent Labs Lab 08/20/17 1045 08/21/17 0350 08/22/17 0402  WBC 19.3* 17.7* 13.2*  HGB 12.3* 11.1* 11.5*  HCT 34.7* 31.4* 33.9*  PLT 258 148* 75*    Coag's  Recent Labs Lab 08/19/17 1030 08/19/17 1240 08/20/17 0322 08/21/17 0828 08/22/17 0402  APTT 40* 41*  --   --   --   INR 2.42 2.53 2.24 1.71 1.37    Sepsis Markers  Recent Labs Lab 08/20/17 1024 08/20/17 1311 08/21/17 0828  LATICACIDVEN 2.6* 2.1* 1.3    ABG  Recent Labs Lab 08/19/17 1130 08/19/17 1700 08/20/17 0415  PHART 7.410 7.537* 7.539*  PCO2ART BELOW REPORTABLE RANGE 16.8* 29.3*  PO2ART 275* 109.0* 100    Liver Enzymes  Recent Labs Lab 08/20/17 0322  08/21/17 0828 08/21/17 1619 08/22/17 0402  AST 302*  --  271*  --  217*  ALT 107*  --  107*  --  102*  ALKPHOS 49  --  50  --  61  BILITOT 7.6*  --  6.9*  --  6.0*  ALBUMIN 2.8*  < > 2.5* 2.4* 2.3*  2.3*  < > = values in this interval not displayed.  Cardiac Enzymes  Recent Labs Lab 08/20/17 1545 08/20/17 2003 08/21/17 0828  TROPONINI 19.74* 14.07* 7.41*    Glucose  Recent Labs Lab 08/20/17 2344 08/21/17 0321 08/21/17 0756 08/21/17 1142 08/22/17 0014 08/22/17 0410  GLUCAP 134* 108* 112* 113* 168* 140*    STUDIES:  RUQ [place] 9/10 > hepatomegaly, gall bladder sludge Liver doppler 9/10> No evidence of portal or splenic vein thrombosis. Middle hepatic vein not  visualized.  Echocardiogram 08/20/17> normal LV, EF 60-65%, mild MR, mild dilation of left atrium.  Hepatitis panel 9/9 > negative HIV 9/9> negative  Autoimmune panel 9/10- ANA, LKM1, smooth muscle, ANCA- negative C3 55, C4 15  CULTURES: Bcx 08/19/17> negative  ANTIBIOTICS:  SIGNIFICANT EVENTS:  LINES/TUBES: Lt [place] CVL> 9/9 HD cath 9/10 >  ASSESSMENT / PLAN: Acute hepatic, renal failure of unclear etiology Associated with lactic acidosis, shock  Acute liver failure ? Mushroom toxicity, alcohol hepatitis Normal tylenol levels, autoimmune work up is negative so far except for decrease in C3 Stop NAC today Follow LFTs, May need liver biopsy per GI service CMV, EBV abs ordered  Renal failure  With acidosis Off CVVH Hopeful for renal recovery  Chest pain Elevated troponin > trending dowm Tele monitoring No intervention needed per cardiology.  The patient is critically ill with multiple organ system failure and requires high complexity decision making for assessment and support, frequent evaluation and titration of therapies, advanced monitoring, review of radiographic studies and interpretation of complex data.   Critical Care Time devoted to patient care services, exclusive of separately billable procedures, described in this note is 35 minutes.   Chilton Greathouse MD [facility] Pager [phone] If no answer or after 3pm call: [phone] 08/22/2017, 10:58 AM

## 2017-08-22 NOTE — Progress Notes (Signed)
Subjective: No pain. Generating more urine.  Objective: Vital signs in last 24 hours: Temp:  [97.5 F (36.4 C)-98.4 F (36.9 C)] 98.4 F (36.9 C) (09/12 1526) Pulse Rate:  [76-100] 88 (09/12 1500) Resp:  [8-30] 16 (09/12 1500) BP: (119-138)/(71-90) 128/83 (09/12 1500) SpO2:  [88 %-100 %] 100 % (09/12 1500) Weight:  [175 lb 14.8 oz (79.8 kg)] 175 lb 14.8 oz (79.8 kg) (09/12 0411) Weight change: -3 lb 9.2 oz (-1.621 kg) Last BM Date: 08/20/17  PE: GEN: Jaundiced, NAD EXT:  Mild edema of extremities. NEURO:  Alert, no encephalopathy  Lab Results: CBC    Component Value Date/Time   WBC 13.2 (H) 08/22/2017 0402   RBC 3.47 (L) 08/22/2017 0402   HGB 11.5 (L) 08/22/2017 0402   HCT 33.9 (L) 08/22/2017 0402   PLT 75 (L) 08/22/2017 0402   MCV 97.7 08/22/2017 0402   MCH 33.1 08/22/2017 0402   MCHC 33.9 08/22/2017 0402   RDW 14.5 08/22/2017 0402   LYMPHSABS 1.3 08/19/2017 1030   MONOABS 1.5 (H) 08/19/2017 1030   EOSABS 0.0 08/19/2017 1030   BASOSABS 0.0 08/19/2017 1030   CMP     Component Value Date/Time   NA 129 (L) 08/22/2017 1419   K 3.1 (L) 08/22/2017 1419   CL 92 (L) 08/22/2017 1419   CO2 28 08/22/2017 1419   GLUCOSE 159 (H) 08/22/2017 1419   BUN 37 (H) 08/22/2017 1419   CREATININE 1.67 (H) 08/22/2017 1419   CALCIUM 8.1 (L) 08/22/2017 1419   PROT 5.0 (L) 08/22/2017 0402   ALBUMIN 2.4 (L) 08/22/2017 1419   AST 217 (H) 08/22/2017 0402   ALT 102 (H) 08/22/2017 0402   ALKPHOS 61 08/22/2017 0402   BILITOT 6.0 (H) 08/22/2017 0402   GFRNONAA 53 (L) 08/22/2017 1419   GFRAA >60 08/22/2017 1419   Assessment:  1. Elevated LFTs, downtrending but with synthetic dysfusion, with coagulopathy. INR is improving (synthetic improvement with help from VitK both likely). EtOH hepatitis versus autoimmune process versus systemic transient multisystem organ damage from underlying infectious or other event. 2. Acute kidney injury, hyponatremia. Urine output improving!  Plan:  1.   Follow supportively; given improvement in LFTs and INR and lack of coagulopathy, as well as improvement in renal function, as well as negative serologies, I don't think the benefits of liver biopsy outweigh the risks at the present time. 2.  Daily LFTs and PT/INR. 3.  Will revisit tomorrow.   Freddy JakschOUTLAW,Zoelle Markus M 08/22/2017, 3:31 PM   Cell [phone] If no answer or after 5 PM call [phone]

## 2017-08-22 NOTE — Progress Notes (Signed)
[facility] Progress Note    Assessment/ Plan:   1.  Acute kidney injury: no prior history of kidney issues.   ANA and ANCA negative, C3 mildly low, C4 normal, UP/C negative, anti-smooth muscle and anti-microsomal LK negative.  I suspect an acute process that happened on Saturday which caused multisystem organ failure- ? All hemodynamically mediated? No obstruction on imaging.  Urine output increasing.  Will HOLD CRRT today and will follow UOP and labs.  2.  Acute liver injury: unclear etiology so far, ? Alcoholic hepatitis.  Hepatitis panel and HIV negative  Other viral causes (CMV, EBV, HSV, tick-borne illnesses) could be considered but these are less likely and would expect to to be much more ill.  Liver US with doppler without clot but some vessels not optimally visualized.  Tylenol level negative.  On NAC.  GI following.  3.  Metabolic acidosis: resolved.  4.  Hyponatremia: trending upward appropriately.    5.  Hyperkalemia: resolved.  6.  Troponemia: cardiology following, TTE without WMA.  Troponins 19--> 14--> 7.  Cardiology following.  Subjective:    Urine output improving.  Serum Na trending upwards.  Pt feeling better.   Objective:   BP 137/90 (BP Location: Left Arm)   Pulse 87   Temp 97.9 F (36.6 C) (Oral)   Resp 18   Ht  (1.778 m)   Wt 79.8 kg (175 lb 14.8 oz)   SpO2 100%   BMI 25.24 kg/m   Intake/Output Summary (Last 24 hours) at 08/22/17 9604 Last data filed at 08/22/17 0900  Gross per 24 hour  Intake             1704 ml  Output             3669 ml  Net            -1965 ml   Weight change: -1.621 kg (-3 lb 9.2 oz)  Physical Exam: GEN NAD HEENT EOMI, PERRL, sclerae icteric NECK R IJ nontunneled catheter PULM clear anteriorly, muffled in bases CV tachycardic, soft systolic murmur ABD mild diffuse tenderness, + hepatomegaly EXT 1+ LE edema NEURO AAO x3, appears less tremulous today SKIN mild jaundice  Imaging: Dg Chest Port  1 View  Result Date: 08/21/2017 CLINICAL DATA:  Respiratory failure. EXAM: PORTABLE CHEST 1 VIEW COMPARISON:  08/20/2017. FINDINGS: Right IJ line, left subclavian line in stable position. Mediastinum hilar structures normal. Cardiomegaly with normal pulmonary vascularity. No focal infiltrate. No pleural effusion or pneumothorax IMPRESSION: 1. Lines tubes in stable position. 2. Stable cardiomegaly.  No pulmonary venous congestion . Electronically Signed   By: Maisie Fus  Register   On: 08/21/2017 07:22   Dg Chest Port 1 View  Result Date: 08/20/2017 CLINICAL DATA:  Encounter for hemodialysis EXAM: PORTABLE CHEST 1 VIEW COMPARISON:  08/19/2017 FINDINGS: Left subclavian central line remains in place with the tip in the SVC. Interval placement of right internal jugular Vas-Cath with the tip in the SVC. No pneumothorax. Heart is upper limits normal in size. Lungs are clear. No effusions. No acute bony abnormality. IMPRESSION: Right dialysis catheter tip in the SVC.  No pneumothorax. Electronically Signed   By: Charlett Nose M.D.   On: 08/20/2017 15:00   US Liver Doppler  Result Date: 08/20/2017 CLINICAL DATA:  Acute liver failure. EXAM: DUPLEX ULTRASOUND OF LIVER TECHNIQUE: Color and duplex Doppler ultrasound was performed to evaluate the hepatic in-flow and out-flow vessels. COMPARISON:  None. FINDINGS: Portal Vein Velocities Main:  24.4  cm/sec Right:  16.8 cm/sec Left:  9.8 cm/sec Normal hepatopetal flow is noted in the portal veins. Hepatic Vein Velocities Right:  29.1 cm/sec Middle:  Not visualized. Left:  36.9 cm/sec Normal hepatofugal flow is noted in visualized hepatic veins. Hepatic Artery Velocity:  96.3 cm/sec Splenic Vein Velocity:  14.4 cm/sec Varices: Absent. Ascites: Ascites is noted. Spleen appears small.  Left pleural effusion is noted. IMPRESSION: There is no evidence of portal or splenic venous thrombosis or occlusion. Middle hepatic vein is not visualized and therefore thrombosis of this structure  cannot be excluded. The right and left hepatic veins are patent and demonstrate normal flow. Mild ascites is noted. Electronically Signed   By: Lupita RaiderJames  Green Jr, M.D.   On: 08/20/2017 12:41    Labs: BMET  Recent Labs Lab 08/19/17 1421 08/19/17 2059 08/20/17 0322 08/20/17 1545 08/20/17 1720 08/20/17 2003 08/21/17 0350 08/21/17 1230 08/21/17 1619 08/22/17 0402  NA 112* 117* 120* 121* 121* 121* 123* 125* 127* 129*  K 6.3* 4.5 4.5 4.2 4.1  --  3.7  --  3.4* 3.1*  CL 68* 70* 72* 73* 74*  --  81*  --  86* 92*  CO2 8* 22 24 22 24   --  25  --  26 28  GLUCOSE 122* 162* 110* 95 107*  --  112*  --  132* 138*  BUN 62* 64* 70* 76* 74*  --  68*  --  56* 42*  CREATININE 5.90* 5.72* 6.03* 6.20* 6.05*  --  4.90*  --  3.39* 2.11*  CALCIUM 9.6 8.8* 8.8* 8.1* 7.8*  --  7.9*  --  8.0* 7.8*  PHOS 8.7*  --  6.0* 6.4* 6.3*  --  5.2*  --  4.3 3.5   CBC  Recent Labs Lab 08/19/17 1030 08/19/17 1240 08/20/17 1045 08/21/17 0350 08/22/17 0402  WBC 15.5*  --  19.3* 17.7* 13.2*  NEUTROABS 12.8*  --   --   --   --   HGB 12.2*  --  12.3* 11.1* 11.5*  HCT 34.9*  --  34.7* 31.4* 33.9*  MCV 98.0  --  95.6 96.0 97.7  PLT 348 317 258 148* 75*    Medications:    . buprenorphine-naloxone  1 tablet Sublingual BID  . Chlorhexidine Gluconate Cloth  6 each Topical Daily  . Chlorhexidine Gluconate Cloth  6 each Topical Q0600  . folic acid  1 mg Oral Daily  . multivitamins with iron  1 tablet Oral Daily  . nicotine  21 mg Transdermal Daily  . pantoprazole (PROTONIX) IV  40 mg Intravenous QHS  . sodium chloride flush  10-40 mL Intracatheter Q12H  . thiamine injection  100 mg Intravenous Daily      Bufford ButtnerElizabeth Capucine Tryon MD [facility] pgr [phone] 08/22/2017, 9:28 AM

## 2017-08-22 NOTE — Progress Notes (Signed)
He is improving subjectively, has improving urine output and improving liver synthesis parameters. Yesterday ECG shows minor inferior T wave changes. The exact cause for his severe multisystem illness is uncertain, although an ingested toxin could well explain it all. No plan for further cardiac evaluation at this time, please re-consult if needed. Thurmon FairMihai Coyt Govoni, MD, [facility] [phone] office [phone] pager

## 2017-08-23 LAB — MAGNESIUM: MAGNESIUM: 2 mg/dL (ref 1.7–2.4)

## 2017-08-23 LAB — CBC
HCT: 33.2 % — ABNORMAL LOW (ref 39.0–52.0)
Hemoglobin: 11.5 g/dL — ABNORMAL LOW (ref 13.0–17.0)
MCH: 34.5 pg — ABNORMAL HIGH (ref 26.0–34.0)
MCHC: 34.6 g/dL (ref 30.0–36.0)
MCV: 99.7 fL (ref 78.0–100.0)
Platelets: 53 10*3/uL — ABNORMAL LOW (ref 150–400)
RBC: 3.33 MIL/uL — AB (ref 4.22–5.81)
RDW: 15.5 % (ref 11.5–15.5)
WBC: 11.3 10*3/uL — ABNORMAL HIGH (ref 4.0–10.5)

## 2017-08-23 LAB — COMPREHENSIVE METABOLIC PANEL
ALBUMIN: 2.2 g/dL — AB (ref 3.5–5.0)
ALT: 94 U/L — ABNORMAL HIGH (ref 17–63)
ANION GAP: 5 (ref 5–15)
AST: 165 U/L — ABNORMAL HIGH (ref 15–41)
Alkaline Phosphatase: 65 U/L (ref 38–126)
BUN: 33 mg/dL — ABNORMAL HIGH (ref 6–20)
CO2: 29 mmol/L (ref 22–32)
Calcium: 7.9 mg/dL — ABNORMAL LOW (ref 8.9–10.3)
Chloride: 94 mmol/L — ABNORMAL LOW (ref 101–111)
Creatinine, Ser: 1.19 mg/dL (ref 0.61–1.24)
GLUCOSE: 107 mg/dL — AB (ref 65–99)
POTASSIUM: 3.3 mmol/L — AB (ref 3.5–5.1)
Sodium: 128 mmol/L — ABNORMAL LOW (ref 135–145)
TOTAL PROTEIN: 4.7 g/dL — AB (ref 6.5–8.1)
Total Bilirubin: 5.4 mg/dL — ABNORMAL HIGH (ref 0.3–1.2)

## 2017-08-23 LAB — EPSTEIN-BARR VIRUS VCA, IGM: EBV VCA IgM: <36 U/mL (ref 0.0–35.9)

## 2017-08-23 LAB — PHOSPHORUS: Phosphorus: 2.2 mg/dL — ABNORMAL LOW (ref 2.5–4.6)

## 2017-08-23 LAB — EPSTEIN-BARR VIRUS VCA, IGG: EBV VCA IgG: 97.4 U/mL — ABNORMAL HIGH (ref 0.0–17.9)

## 2017-08-23 LAB — CMV IGM: CMV IgM: <30 AU/mL (ref 0.0–29.9)

## 2017-08-23 MED ORDER — POTASSIUM PHOSPHATES 15 MMOLE/5ML IV SOLN
40.0000 meq | Freq: Once | INTRAVENOUS | Status: AC
  Administered 2017-08-23: 40 meq via INTRAVENOUS
  Filled 2017-08-23: qty 9.09

## 2017-08-23 MED ORDER — ENSURE ENLIVE PO LIQD
237.0000 mL | Freq: Every day | ORAL | Status: DC
  Administered 2017-08-24 – 2017-08-26 (×3): 237 mL via ORAL

## 2017-08-23 NOTE — Progress Notes (Addendum)
Nutrition Follow Up  DOCUMENTATION CODES:   Not applicable  INTERVENTION:    Ensure Enlive po daily, each supplement provides 350 kcal and 20 grams of protein.   Rec checking thiamine and Vitamin B12 levels. 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  NEW NUTRITION DIAGNOSIS:   Increased nutrient needs related to acute illness, acute liver failure as evidenced by estimated needs, ongoing  GOAL:   Patient will meet greater than or equal to 90% of their needs, progresing  MONITOR:   PO intake, Supplement acceptance, Labs, Weight trends, Skin, I & O's  ASSESSMENT:   31 y.o. male here with a myriad of concerning symptoms: body swelling, body aches, fatigue, night sweats, upper chest/epigastric pain, n/v/d over the last 3-4 weeks, then 2-4 days ago started having SOB, worsening swelling, bruising, jaundice, tongue ulcers, dark urine. He does drink alcohol but has cut back quite a bit.    Pt with acute hepatic and renal failure with unclear etiology. Reports he ate well this AM for breakfast (meal completion 90%). Initially reluctant to Ensure supplements. After RD explained benefits pt amenable.  RN present at time of visit and encouraged drinking as well.  Medications reviewed and include MVI, folvite and thiamine. Labs reviewed. Phos 2.2 (L). Na 128 (L). K 3.3 (L). Started on CVVHD 9/10. Stopped today. CBG's [id number].  Diet Order:  Diet renal with fluid restriction Fluid restriction: 1200 mL Fluid; Room service appropriate? Yes; Fluid consistency: Thin  Skin:  Reviewed, no issues  Last BM:  9/12   Intake/Output Summary (Last 24 hours) at 08/23/17 1116 Last data filed at 08/23/17 1100  Gross per 24 hour  Intake             1363 ml  Output             1025 ml  Net              338 ml   Height:   Ht Readings from Last 1 Encounters:  08/19/17 5' 10"  (1.778 m)   Weight:   Wt Readings from Last 1 Encounters:  08/23/17 171 lb 15.3 oz (78 kg)   Ideal Body Weight:  75.4 kg  BMI:   Body mass index is 24.67 kg/m.  Estimated Nutritional Needs:   Kcal:  1850-2150  Protein:  70-85 gm  Fluid:  per MD  EDUCATION NEEDS:   No education needs identified at this time  Arthur Holms, RD, LDN Pager #: [phone] After-Hours Pager #: [phone]

## 2017-08-23 NOTE — Care Management Note (Signed)
Case Management Note  Patient Details  Name: Seth Obrien MRN: [id number] Date of Birth: 09/05/1986  Subjective/Objective:   Pt admitted with jaundice, acute renal failure (off CRRT)  and dyspnea                Action/Plan:  PTA from home independent.  Pt is on suboxone for opiate abuse.  PT eval pending.  CM will continue to follow for discharge needs   Expected Discharge Date:                  Expected Discharge Plan:  Home/Self Care  In-House Referral:     Discharge planning Services  CM Consult  Post Acute Care Choice:    Choice offered to:     DME Arranged:    DME Agency:     HH Arranged:    HH Agency:     Status of Service:     If discussed at [employer] Length of Stay Meetings, dates discussed:    Additional Comments:  Cherylann ParrClaxton, Haniya Fern S, RN 08/23/2017, 2:55 PM

## 2017-08-23 NOTE — Progress Notes (Signed)
[facility] Progress Note    Assessment/ Plan:   1.  Acute kidney injury: no prior history of kidney issues.   ANA and ANCA negative, C3 mildly low, C4 normal, UP/C negative, anti-smooth muscle and anti-microsomal LK negative.  I suspect an acute process that happened on Saturday which caused multisystem organ failure- ? All hemodynamically mediated? No obstruction on imaging.  Cr nearly normal and urine output increased.  No need for more dialysis.  Nephrology will sign off.  2.  Acute liver injury: unclear etiology so far, ? Alcoholic hepatitis.  Hepatitis panel and HIV negative  Other viral causes (CMV, EBV, HSV, tick-borne illnesses) could be considered but these are less likely and would expect to to be much more ill.  Liver US with doppler without clot but some vessels not optimally visualized.  Tylenol level negative.  s/p NAC.  GI following.  3.  Metabolic acidosis: resolved.  4.  Hyponatremia: trending upward appropriately, expect to resolve with increased free water excretion in recovery phase of ATN.  5.  Hyperkalemia: resolved.  6.  Troponemia: TTE without WMA.  Troponins 19--> 14--> 7.  Cardiology s/o  7.  Thrombocytopenia: reflective of hepatic synthetic dysfunction most likely.  To be sure, would stop [place] heparin and PPI.  Subjective:    Improved.  Pt feels much better.  Cr nearly normal. UOP increased.  CRRT stopped yesterday.   Objective:   BP 119/88   Pulse 80   Temp 98.3 F (36.8 C) (Oral)   Resp 10   Ht 5\' 10"  (1.778 m)   Wt 78 kg (171 lb 15.3 oz)   SpO2 98%   BMI 24.67 kg/m   Intake/Output Summary (Last 24 hours) at 08/23/17 0933 Last data filed at 08/23/17 0800  Gross per 24 hour  Intake             1195 ml  Output              600 ml  Net              595 ml   Weight change: -1.8 kg (-3 lb 15.5 oz)  Physical Exam: GEN NAD HEENT EOMI, PERRL, sclerae icteric but much improved NECK R IJ nontunneled catheter PULM clear anteriorly,  muffled in bases CV tachycardic, soft systolic murmur ABD nontender + hepatomegaly EXT 1-2+ LE edema NEURO AAO x3 SKIN jaundice improved  Imaging: No results found.  Labs: BMET  Recent Labs Lab 08/20/17 1545 08/20/17 1720 08/20/17 2003 08/21/17 0350 08/21/17 1230 08/21/17 1619 08/22/17 0402 08/22/17 1419 08/23/17 0349  NA 121* 121* 121* 123* 125* 127* 129* 129* 128*  K 4.2 4.1  --  3.7  --  3.4* 3.1* 3.1* 3.3*  CL 73* 74*  --  81*  --  86* 92* 92* 94*  CO2 22 24  --  25  --  26 28 28 29   GLUCOSE 95 107*  --  112*  --  132* 138* 159* 107*  BUN 76* 74*  --  68*  --  56* 42* 37* 33*  CREATININE 6.20* 6.05*  --  4.90*  --  3.39* 2.11* 1.67* 1.19  CALCIUM 8.1* 7.8*  --  7.9*  --  8.0* 7.8* 8.1* 7.9*  PHOS 6.4* 6.3*  --  5.2*  --  4.3 3.5 2.7 2.2*   CBC  Recent Labs Lab 08/19/17 1030  08/20/17 1045 08/21/17 0350 08/22/17 0402 08/23/17 0349  WBC 15.5*  --  19.3* 17.7*  13.2* 11.3*  NEUTROABS 12.8*  --   --   --   --   --   HGB 12.2*  --  12.3* 11.1* 11.5* 11.5*  HCT 34.9*  --  34.7* 31.4* 33.9* 33.2*  MCV 98.0  --  95.6 96.0 97.7 99.7  PLT 348  < > 258 148* 75* 53*  < > = values in this interval not displayed.  Medications:    . buprenorphine-naloxone  1 tablet Sublingual BID  . Chlorhexidine Gluconate Cloth  6 each Topical Daily  . Chlorhexidine Gluconate Cloth  6 each Topical Q0600  . folic acid  1 mg Oral Daily  . heparin subcutaneous  5,000 Units Subcutaneous Q8H  . multivitamins with iron  1 tablet Oral Daily  . nicotine  21 mg Transdermal Daily  . pantoprazole  40 mg Oral BID  . sodium chloride flush  10-40 mL Intracatheter Q12H  . thiamine injection  100 mg Intravenous Daily      Bufford Buttner MD [facility] pgr [phone] 08/23/2017, 9:33 AM

## 2017-08-23 NOTE — Progress Notes (Addendum)
PULMONARY / CRITICAL CARE MEDICINE   Name: Seth Obrien MRN: [id number] DOB: 08-Oct-1986    ADMISSION DATE:  08/19/2017  CHIEF COMPLAINT:  Abdominal distention, jaundice, and dyspnea.  HISTORY OF PRESENT ILLNESS:   This is a 31 year old who normally enjoys normal active health. Approximately 2 weeks ago he started having difficulties with lower extremity swelling and in more recent days abdominal swelling which makes it so dispensed don't fit. He has noticed jaundice and fatigue, nausea and vomiting. Noted to be in liver, renal failure with elevated LA, metabolic acidosis and troponin. Admitted to ICU for management.   Patient he said he ate some mushrooms from his yard about 1.5 months ago. No new meds or diet. Travel to [place] in April. No other recent travel. Drinks 3-4 hard ciders/day  He has a history of opiate abuse (on suboxone), but denies ever using needles in the past or having history of exposure to hepatitis.  PAST MEDICAL HISTORY :  He  has a past medical history of Eczema and Heartburn.  PAST SURGICAL HISTORY: He  has no past surgical history on file.  No Known Allergies   His been taking Suboxone under supervision for the past 2 years  No current facility-administered medications on file prior to encounter.    No current outpatient prescriptions on file prior to encounter.    FAMILY HISTORY:  He has no family history of early liver disease. He is not aware of any problems with copper metabolism and has never heard of the words Wilson's disease.   SOCIAL HISTORY: He drinks alcohol as noted. He smokes 1/2-1 pack of cigarettes per day. Reports no use of street drugs for the past 2 years and denies ever having used needles. 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  REVIEW OF SYSTEMS:   All negative; except for those that are bolded, which indicate positives.  Constitutional: weight loss,  weight gain, night sweats, fevers, chills, fatigue, weakness.  HEENT: headaches, sore throat, sneezing, nasal congestion, post nasal drip, difficulty swallowing, tooth/dental problems, visual complaints, visual changes, ear aches. Neuro: difficulty with speech, weakness, numbness, ataxia. CV:  chest pain, orthopnea, PND, swelling in lower extremities, dizziness, palpitations, syncope.  Resp: cough, hemoptysis, dyspnea, wheezing. GI: heartburn, indigestion, abdominal pain, nausea, vomiting, diarrhea, constipation, change in bowel habits, loss of appetite, hematemesis, melena, hematochezia.  GU: dysuria, change in color of urine, urgency or frequency, flank pain, hematuria. MSK: joint pain or swelling, decreased range of motion. Psych: change in mood or affect, depression, anxiety, suicidal ideations, homicidal ideations. Skin: rash, itching, bruising.  SUBJECTIVE:  Off CVVH No new complaints  VITAL SIGNS: BP 136/77 (BP Location: Left Arm)   Pulse 82   Temp 98.3 F (36.8 C) (Oral)   Resp 10   Ht  (1.778 m)   Wt 171 lb 15.3 oz (78 kg)   SpO2 99%   BMI 24.67 kg/m   HEMODYNAMICS: CVP:  [2 mmHg-3 mmHg] 2 mmHg  VENTILATOR SETTINGS:    INTAKE / OUTPUT: I/O last 3 completed shifts: In: 2073 [P.O.:1514; I.V.:559] Out: 3103 [Urine:1400; Other:1703]  PHYSICAL EXAMINATION: Blood pressure 136/77, pulse 82, temperature 98.3 F (36.8 C), temperature source Oral, resp. rate 10, height  (1.778 m), weight 171 lb 15.3 oz (78 kg), SpO2 99 %. Gen:      No acute distress HEENT:  EOMI, sclera icteric Neck:     No masses; no  thyromegaly Lungs:    Clear to auscultation bilaterally; normal respiratory effort CV:         Regular rate and rhythm; no murmurs Abd:      + bowel sounds; soft, non-tender; no palpable masses, no distension Ext:    No edema; adequate peripheral perfusion Skin:      Warm and dry; no rash Neuro: alert and oriented x 3 Psych: normal mood and  affect  LABS:  BMET  Recent Labs Lab 08/22/17 0402 08/22/17 1419 08/23/17 0349  NA 129* 129* 128*  K 3.1* 3.1* 3.3*  CL 92* 92* 94*  CO2 28 28 29   BUN 42* 37* 33*  CREATININE 2.11* 1.67* 1.19  GLUCOSE 138* 159* 107*    Electrolytes  Recent Labs Lab 08/21/17 0350  08/22/17 0402 08/22/17 1419 08/23/17 0349  CALCIUM 7.9*  < > 7.8* 8.1* 7.9*  MG 1.9  --  2.1  --  2.0  PHOS 5.2*  < > 3.5 2.7 2.2*  < > = values in this interval not displayed.  CBC  Recent Labs Lab 08/21/17 0350 08/22/17 0402 08/23/17 0349  WBC 17.7* 13.2* 11.3*  HGB 11.1* 11.5* 11.5*  HCT 31.4* 33.9* 33.2*  PLT 148* 75* 53*    Coag's  Recent Labs Lab 08/19/17 1030 08/19/17 1240 08/20/17 0322 08/21/17 0828 08/22/17 0402  APTT 40* 41*  --   --   --   INR 2.42 2.53 2.24 1.71 1.37    Sepsis Markers  Recent Labs Lab 08/20/17 1024 08/20/17 1311 08/21/17 0828  LATICACIDVEN 2.6* 2.1* 1.3    ABG  Recent Labs Lab 08/19/17 1130 08/19/17 1700 08/20/17 0415  PHART 7.410 7.537* 7.539*  PCO2ART BELOW REPORTABLE RANGE 16.8* 29.3*  PO2ART 275* 109.0* 100    Liver Enzymes  Recent Labs Lab 08/21/17 0828  08/22/17 0402 08/22/17 1419 08/23/17 0349  AST 271*  --  217*  --  165*  ALT 107*  --  102*  --  94*  ALKPHOS 50  --  61  --  65  BILITOT 6.9*  --  6.0*  --  5.4*  ALBUMIN 2.5*  < > 2.3*  2.3* 2.4* 2.2*  < > = values in this interval not displayed.  Cardiac Enzymes  Recent Labs Lab 08/20/17 1545 08/20/17 2003 08/21/17 0828  TROPONINI 19.74* 14.07* 7.41*    Glucose  Recent Labs Lab 08/20/17 2344 08/21/17 0321 08/21/17 0756 08/21/17 1142 08/22/17 0014 08/22/17 0410  GLUCAP 134* 108* 112* 113* 168* 140*    STUDIES:  RUQ US 9/10 > hepatomegaly, gall bladder sludge Liver doppler 9/10> No evidence of portal or splenic vein thrombosis. Middle hepatic vein not visualized.  Echocardiogram 08/20/17> normal LV, EF 60-65%, mild MR, mild dilation of left  atrium.  Hepatitis panel 9/9 > negative HIV 9/9> negative  Autoimmune panel 9/10- ANA, LKM1, smooth muscle, ANCA- negative C3 55, C4 15  CULTURES: Bcx 08/19/17> negative  ANTIBIOTICS:  SIGNIFICANT EVENTS:  LINES/TUBES: Lt [place] CVL> 9/9 HD cath 9/10 >  ASSESSMENT / PLAN: Acute hepatic, renal failure of unclear etiology Associated with lactic acidosis, shock  Acute liver failure ? Mushroom toxicity, alcohol hepatitis Normal tylenol levels, autoimmune work up is negative so far except for decrease in C3 CMV is negative Off NAC Follow LFTs, May need liver biopsy per GI service Follow EBV abs  Renal failure with acidosis. Improving Off CVVH. Making urine D/C HD cath  Chest pain Elevated troponin > trending dowm Tele monitoring No intervention needed  per cardiology.  Thrombocytopenia Likely secondary to liver, renal dysfunction Follow CBC Stop SQ heparin  Pt is weak and deconditioned. Will need PT to work with him. Stable for transfer out of ICU  Chilton Greathouse MD [facility] Pager [phone] If no answer or after 3pm call: [phone] 08/23/2017, 10:32 AM

## 2017-08-23 NOTE — Progress Notes (Signed)
Subjective: Resting in bed.  Objective: Vital signs in last 24 hours: Temp:  [98.3 F (36.8 C)-98.8 F (37.1 C)] 98.8 F (37.1 C) (09/13 1123) Pulse Rate:  [78-97] 91 (09/13 1100) Resp:  [10-24] 10 (09/13 0900) BP: (106-136)/(77-99) 134/87 (09/13 1100) SpO2:  [97 %-100 %] 98 % (09/13 1100) Weight:  [171 lb 15.3 oz (78 kg)] 171 lb 15.3 oz (78 kg) (09/13 0500) Weight change: -3 lb 15.5 oz (-1.8 kg) Last BM Date: 08/20/17  PE: GEN:  Jaundiced, resting comfortably in bed  Lab Results: CBC    Component Value Date/Time   WBC 11.3 (H) 08/23/2017 0349   RBC 3.33 (L) 08/23/2017 0349   HGB 11.5 (L) 08/23/2017 0349   HCT 33.2 (L) 08/23/2017 0349   PLT 53 (L) 08/23/2017 0349   MCV 99.7 08/23/2017 0349   MCH 34.5 (H) 08/23/2017 0349   MCHC 34.6 08/23/2017 0349   RDW 15.5 08/23/2017 0349   LYMPHSABS 1.3 08/19/2017 1030   MONOABS 1.5 (H) 08/19/2017 1030   EOSABS 0.0 08/19/2017 1030   BASOSABS 0.0 08/19/2017 1030   CMP     Component Value Date/Time   NA 128 (L) 08/23/2017 0349   K 3.3 (L) 08/23/2017 0349   CL 94 (L) 08/23/2017 0349   CO2 29 08/23/2017 0349   GLUCOSE 107 (H) 08/23/2017 0349   BUN 33 (H) 08/23/2017 0349   CREATININE 1.19 08/23/2017 0349   CALCIUM 7.9 (L) 08/23/2017 0349   PROT 4.7 (L) 08/23/2017 0349   ALBUMIN 2.2 (L) 08/23/2017 0349   AST 165 (H) 08/23/2017 0349   ALT 94 (H) 08/23/2017 0349   ALKPHOS 65 08/23/2017 0349   BILITOT 5.4 (H) 08/23/2017 0349   GFRNONAA >60 08/23/2017 0349   GFRAA >60 08/23/2017 0349   Assessment:  1. Elevated LFTs, downtrending but with synthetic dysfusion, with coagulopathy, but I NR is improving (synthetic improvement with help from VitK both likely).EtOH hepatitis versus autoimmune process versus systemic transient multisystem organ damage from underlying infectious or other event. 2. Acute kidney injury, hyponatremia. Urine output improving!  Plan:   1.  Follow supportively. 2.  In light of improvement in LFTs and  INR and lack of coagulopathy, as well as improvement in renal function, as well as negative serologies, I don't think the benefits of liver biopsy outweigh the risks at the present time. 3.  Daily PT/INR and LFTs. 4.  Will follow.   Freddy JakschOUTLAW,Levii Hairfield M 08/23/2017, 1:40 PM   Cell [phone] If no answer or after 5 PM call [phone]

## 2017-08-24 ENCOUNTER — Ambulatory Visit: Payer: Self-pay | Admitting: Family Medicine

## 2017-08-24 DIAGNOSIS — R17 Unspecified jaundice: Secondary | ICD-10-CM

## 2017-08-24 LAB — CBC
HCT: 31.5 % — ABNORMAL LOW (ref 39.0–52.0)
Hemoglobin: 10.6 g/dL — ABNORMAL LOW (ref 13.0–17.0)
MCH: 33.9 pg (ref 26.0–34.0)
MCHC: 33.7 g/dL (ref 30.0–36.0)
MCV: 100.6 fL — AB (ref 78.0–100.0)
PLATELETS: 53 10*3/uL — AB (ref 150–400)
RBC: 3.13 MIL/uL — ABNORMAL LOW (ref 4.22–5.81)
RDW: 15.8 % — AB (ref 11.5–15.5)
WBC: 8.9 10*3/uL (ref 4.0–10.5)

## 2017-08-24 LAB — COMPREHENSIVE METABOLIC PANEL
ALT: 90 U/L — ABNORMAL HIGH (ref 17–63)
ANION GAP: 4 — AB (ref 5–15)
AST: 138 U/L — ABNORMAL HIGH (ref 15–41)
Albumin: 2.3 g/dL — ABNORMAL LOW (ref 3.5–5.0)
Alkaline Phosphatase: 57 U/L (ref 38–126)
BUN: 20 mg/dL (ref 6–20)
CALCIUM: 7.9 mg/dL — AB (ref 8.9–10.3)
CO2: 31 mmol/L (ref 22–32)
Chloride: 95 mmol/L — ABNORMAL LOW (ref 101–111)
Creatinine, Ser: 0.89 mg/dL (ref 0.61–1.24)
GFR calc non Af Amer: >60 mL/min (ref >60)
Glucose, Bld: 89 mg/dL (ref 65–99)
Potassium: 3.2 mmol/L — ABNORMAL LOW (ref 3.5–5.1)
SODIUM: 130 mmol/L — AB (ref 135–145)
TOTAL PROTEIN: 4.6 g/dL — AB (ref 6.5–8.1)
Total Bilirubin: 4.4 mg/dL — ABNORMAL HIGH (ref 0.3–1.2)

## 2017-08-24 LAB — MAGNESIUM: Magnesium: 1.7 mg/dL (ref 1.7–2.4)

## 2017-08-24 LAB — CULTURE, BLOOD (ROUTINE X 2)
CULTURE: NO GROWTH
Culture: NO GROWTH
Special Requests: ADEQUATE

## 2017-08-24 LAB — PHOSPHORUS: Phosphorus: 3.1 mg/dL (ref 2.5–4.6)

## 2017-08-24 LAB — RSV(RESPIRATORY SYNCYTIAL VIRUS) AB, BLOOD: RSV AB: NEGATIVE

## 2017-08-24 MED ORDER — FUROSEMIDE 20 MG PO TABS
20.0000 mg | ORAL_TABLET | Freq: Every day | ORAL | Status: DC
  Administered 2017-08-25: 20 mg via ORAL
  Filled 2017-08-24: qty 1

## 2017-08-24 MED ORDER — VITAMIN B-1 100 MG PO TABS
100.0000 mg | ORAL_TABLET | Freq: Every day | ORAL | Status: DC
  Administered 2017-08-25 – 2017-08-26 (×2): 100 mg via ORAL
  Filled 2017-08-24 (×2): qty 1

## 2017-08-24 NOTE — Evaluation (Signed)
Physical Therapy Evaluation Patient Details Name: Seth Obrien MRN: [id number] DOB: [birth year] Today's Date: 08/24/2017   History of Present Illness   31 year old who normally enjoys normal active health. Approximately 2 weeks ago he started having difficulties with lower extremity swelling and in more recent days abdominal swelling. He has noticed jaundice and fatigue, nausea and vomiting. Noted to be in liver, renal failure with elevated LA, metabolic acidosis and troponin. Admitted to ICU for management  Clinical Impression  Pt noted to have pitting edema B feet and lower legs. Pt was encouraged to perform ankle pumps and elevated LEs. He ambulated 250' with RW without loss of balance. Pt is safe to ambulate in halls with RW independently, I encouraged pt to do so at least BID. No further PT indicated, will sign off.     Follow Up Recommendations No PT follow up    Equipment Recommendations  Rolling walker with 5" wheels    Recommendations for Other Services       Precautions / Restrictions Precautions Precautions: None Precaution Comments: denies h/o falls Restrictions Weight Bearing Restrictions: No      Mobility  Bed Mobility Overal bed mobility: Independent                Transfers Overall transfer level: Independent                  Ambulation/Gait Ambulation/Gait assistance: Supervision Ambulation Distance (Feet): 250 Feet Assistive device: Rolling walker (2 wheeled) Gait Pattern/deviations: Step-through pattern;Trunk flexed   Gait velocity interpretation: at or above normal speed for age/gender General Gait Details: VCs to correct flexed posture, pt reports having a "bad back" from working on [employer] and is somewhat flexed at baseline; steady with RW, no LOB  Stairs            Wheelchair Mobility    Modified Rankin (Stroke Patients Only)       Balance Overall balance assessment: Modified Independent                                           Pertinent Vitals/Pain Pain Assessment: 0-10 Pain Score: 4  Pain Location: " hurt all over" Pain Descriptors / Indicators: Aching Pain Intervention(s): Limited activity within patient's tolerance;Monitored during session    Home Living Family/patient expects to be discharged to:: Private residence Living Arrangements: Spouse/significant other Available Help at Discharge: Family;Available 24 hours/day Type of Home: House Home Access: Stairs to enter   [employer] of Steps: 2 Home Layout: Two level;Bed/bath upstairs Home Equipment: None      Prior Function Level of Independence: Independent               Hand Dominance        Extremity/Trunk Assessment   Upper Extremity Assessment Upper Extremity Assessment: Overall WFL for tasks assessed    Lower Extremity Assessment Lower Extremity Assessment: Overall WFL for tasks assessed (pitting edema noted B feet and lower legs)    Cervical / Trunk Assessment Cervical / Trunk Assessment: Normal  Communication   Communication: No difficulties  Cognition Arousal/Alertness: Awake/alert Behavior During Therapy: WFL for tasks assessed/performed Overall Cognitive Status: Within Functional Limits for tasks assessed  General Comments      Exercises     Assessment/Plan    PT Assessment Patent does not need any further PT services  PT Problem List         PT Treatment Interventions      PT Goals (Current goals can be found in the Care Plan section)  Acute Rehab PT Goals Patient Stated Goal: walk dogs, go fishing, go to real estate school (had planned to start last week) PT Goal Formulation: All assessment and education complete, DC therapy    Frequency     Barriers to discharge        Co-evaluation               AM-PAC PT "6 Clicks" Daily Activity  Outcome Measure Difficulty turning over in bed  (including adjusting bedclothes, sheets and blankets)?: None Difficulty moving from lying on back to sitting on the side of the bed? : None Difficulty sitting down on and standing up from a chair with arms (e.g., wheelchair, bedside commode, etc,.)?: None Help needed moving to and from a bed to chair (including a wheelchair)?: None Help needed walking in hospital room?: None Help needed climbing 3-5 steps with a railing? : None 6 Click Score: 24    End of Session Equipment Utilized During Treatment: Gait belt Activity Tolerance: Patient tolerated treatment well Patient left: in bed;with call bell/phone within reach Nurse Communication: Mobility status      Time: 1610-9604 PT Time Calculation (min) (ACUTE ONLY): 19 min   Charges:   PT Evaluation $PT Eval Low Complexity: 1 Low     PT G Codes:          Tamala Ser 08/24/2017, 11:13 AM [phone]

## 2017-08-24 NOTE — Progress Notes (Signed)
Subjective: No complaints.  Objective: Vital signs in last 24 hours: Temp:  [98.4 F (36.9 C)-99.2 F (37.3 C)] 98.4 F (36.9 C) (09/14 1610) Pulse Rate:  [76-86] 80 (09/14 0613) Resp:  [14-18] 18 (09/14 0613) BP: (121-129)/(80-96) 126/81 (09/14 0613) SpO2:  [98 %-100 %] 98 % (09/14 0613) Weight:  [183 lb 8 oz (83.2 kg)-184 lb 1.6 oz (83.5 kg)] 184 lb 1.6 oz (83.5 kg) (09/14 0500) Weight change: 11 lb 8.7 oz (5.235 kg) Last BM Date: 08/23/17  PE: GEN: NAD, jaundiced. NEURO:  Alert; no encephalopathy  Lab Results: CBC    Component Value Date/Time   WBC 8.9 08/24/2017 0555   RBC 3.13 (L) 08/24/2017 0555   HGB 10.6 (L) 08/24/2017 0555   HCT 31.5 (L) 08/24/2017 0555   PLT 53 (L) 08/24/2017 0555   MCV 100.6 (H) 08/24/2017 0555   MCH 33.9 08/24/2017 0555   MCHC 33.7 08/24/2017 0555   RDW 15.8 (H) 08/24/2017 0555   LYMPHSABS 1.3 08/19/2017 1030   MONOABS 1.5 (H) 08/19/2017 1030   EOSABS 0.0 08/19/2017 1030   BASOSABS 0.0 08/19/2017 1030   CMV IGM negative EBV IGG positive (prior exposure); EBV IGM negative for acute infection CMP     Component Value Date/Time   NA 130 (L) 08/24/2017 0555   K 3.2 (L) 08/24/2017 0555   CL 95 (L) 08/24/2017 0555   CO2 31 08/24/2017 0555   GLUCOSE 89 08/24/2017 0555   BUN 20 08/24/2017 0555   CREATININE 0.89 08/24/2017 0555   CALCIUM 7.9 (L) 08/24/2017 0555   PROT 4.6 (L) 08/24/2017 0555   ALBUMIN 2.3 (L) 08/24/2017 0555   AST 138 (H) 08/24/2017 0555   ALT 90 (H) 08/24/2017 0555   ALKPHOS 57 08/24/2017 0555   BILITOT 4.4 (H) 08/24/2017 0555   GFRNONAA >60 08/24/2017 0555   GFRAA >60 08/24/2017 0555    Assessment:  1.  Elevated LFTs, improving, serologic work-up negative. 2.  Acute kidney injury, improving, had transient CVVHD. 3.  Hyponatremia, improving.  Plan:  1.  Advance diet as tolerated. 2.  Fluid management (diuretics, etc) per renal and primary team. 3.  Follow LFTs. 4.  Will sign-off; will arrange repeat LFTs +  PT/INR  in 1-2 weeks and follow-up OV in 3-4 weeks.  Please call with questions; thank you for the consultation.   Freddy Jaksch 08/24/2017, 1:30 PM   Cell [phone] If no answer or after 5 PM call [phone]

## 2017-08-24 NOTE — Progress Notes (Signed)
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 Admit 9/9 with 2 /52 h/o abd pain, some n/v, also green diarr  hypotensive on admit with anasarca hsm palpated by CCM admitting MD  Found to have on admit profound met acidosis, CO2 <7, sodium 113 LDH 422, Ammonia 54, Bili 8.8, Lactic acid >17, LDH 422, K=5.7 CT abd= hsm  initially placed on CRRT by nephrology and kidney fucntion recovered so they s/o 9/13 GI saw patient--placed initally by CCM on NAC, AI panel done-Vit k given-Following  Suspected to be MODS-transiently ischemic     Assessment & Plan:   Active Problems:   Acute hepatic failure   Metabolic acidosis   Acute hepatic failure Predominant bilirubinemia with elevated LDH on admission  wonder if this was a toxic exposure versus some unspecified virus + myocarditis that we just have not caught  GI has seen and do not think that liver biopsy is necessary at present  Platelets are still 50 and bilirubin is trending down from 6-4 so will monitor  Avoid Tylenol and NSAIDs for now and monitor  Likely can discharge soon  Acute renal failure, earlier in admission on CRRT  Stable at present time liberalize salt diet and allow heart healthy  Might need to give one to 2 doses of Lasix is still has some mild anasarca, will give Lasix by mouth 20 mg 9:15 AM  ? Myocarditis  Troponins trending down no further workup needed    Long discussion with patient's wife who is an emergency room PA Likely 1-2 days more needed Follow labs  Consultants:   Gastroenterology  Procedures:   None  Antimicrobials:   None    Subjective:  Fair no new issues Just weak walked with therapy No cp Swollen ++ eating good  Objective: Vitals:   08/23/17 1808 08/23/17 2106 08/24/17 0500 08/24/17 0613  BP: 129/90 126/82  126/81  Pulse: 84 86  80    Resp: 16 14  18   Temp: 99.1 F (37.3 C) 99.2 F (37.3 C)  98.4 F (36.9 C)  TempSrc: Oral   Oral  SpO2: 99% 100%  98%  Weight: 83.2 kg (183 lb 8 oz)  83.5 kg (184 lb 1.6 oz)   Height: 5' 10"  (1.778 m)       Intake/Output Summary (Last 24 hours) at 08/24/17 0719 Last data filed at 08/24/17 0932  Gross per 24 hour  Intake             1349 ml  Output             1475 ml  Net             -126 ml   Filed Weights   08/23/17 0500 08/23/17 1808 08/24/17 0500  Weight: 78 kg (171 lb 15.3 oz) 83.2 kg (183 lb 8 oz) 83.5 kg (184 lb 1.6 oz)    Examination:  EOMI NCAT Icteric + + Mucosa moist Chest is clinically clear no added sound No hepatosplenomegaly some mild epigastric tenderness Anasarca No tremor or flap  Data Reviewed: I have personally reviewed following labs and imaging studies  CBC:  Recent Labs Lab 08/19/17 1030 08/19/17 1240 08/20/17 1045 08/21/17 0350 08/22/17 0402 08/23/17 0349  WBC 15.5*  --  19.3* 17.7* 13.2* 11.3*  NEUTROABS 12.8*  --   --   --   --   --  HGB 12.2*  --  12.3* 11.1* 11.5* 11.5*  HCT 34.9*  --  34.7* 31.4* 33.9* 33.2*  MCV 98.0  --  95.6 96.0 97.7 99.7  PLT 348 317 258 148* 75* 53*   Basic Metabolic Panel:  Recent Labs Lab 08/20/17 0322  08/21/17 0350  08/21/17 1619 08/22/17 0402 08/22/17 1419 08/23/17 0349 08/24/17 0555  NA 120*  < > 123*  < > 127* 129* 129* 128* 130*  K 4.5  < > 3.7  --  3.4* 3.1* 3.1* 3.3* 3.2*  CL 72*  < > 81*  --  86* 92* 92* 94* 95*  CO2 24  < > 25  --  26 28 28 29 31   GLUCOSE 110*  < > 112*  --  132* 138* 159* 107* 89  BUN 70*  < > 68*  --  56* 42* 37* 33* 20  CREATININE 6.03*  < > 4.90*  --  3.39* 2.11* 1.67* 1.19 0.89  CALCIUM 8.8*  < > 7.9*  --  8.0* 7.8* 8.1* 7.9* 7.9*  MG 1.7  --  1.9  --   --  2.1  --  2.0 1.7  PHOS 6.0*  < > 5.2*  --  4.3 3.5 2.7 2.2* 3.1  < > = values in this interval not displayed. GFR: Estimated Creatinine Clearance: 92.9 mL/min (by C-G formula based on SCr of 1.19  mg/dL). Liver Function Tests:  Recent Labs Lab 08/19/17 1030 08/20/17 0322  08/21/17 0828 08/21/17 1619 08/22/17 0402 08/22/17 1419 08/23/17 0349  AST <5* 302*  --  271*  --  217*  --  165*  ALT <5* 107*  --  107*  --  102*  --  94*  ALKPHOS 60 49  --  50  --  61  --  65  BILITOT 8.8* 7.6*  --  6.9*  --  6.0*  --  5.4*  PROT 6.7 5.8*  --  5.1*  --  5.0*  --  4.7*  ALBUMIN 3.4* 2.8*  < > 2.5* 2.4* 2.3*  2.3* 2.4* 2.2*  < > = values in this interval not displayed.  Recent Labs Lab 08/19/17 1030  LIPASE 84*    Recent Labs Lab 08/19/17 1240  AMMONIA 54*   Coagulation Profile:  Recent Labs Lab 08/19/17 1030 08/19/17 1240 08/20/17 0322 08/21/17 0828 08/22/17 0402  INR 2.42 2.53 2.24 1.71 1.37   Cardiac Enzymes:  Recent Labs Lab 08/20/17 1045 08/20/17 1400 08/20/17 1545 08/20/17 2003 08/21/17 0828  CKTOTAL  --  477*  --   --   --   TROPONINI 19.34*  --  19.74* 14.07* 7.41*   BNP (last 3 results) No results for input(s): PROBNP in the last 8760 hours. HbA1C: No results for input(s): HGBA1C in the last 72 hours. CBG:  Recent Labs Lab 08/21/17 0321 08/21/17 0756 08/21/17 1142 08/22/17 0014 08/22/17 0410  GLUCAP 108* 112* 113* 168* 140*   Lipid Profile: No results for input(s): CHOL, HDL, LDLCALC, TRIG, CHOLHDL, LDLDIRECT in the last 72 hours. Thyroid Function Tests: No results for input(s): TSH, T4TOTAL, FREET4, T3FREE, THYROIDAB in the last 72 hours. Anemia Panel: No results for input(s): VITAMINB12, FOLATE, FERRITIN, TIBC, IRON, RETICCTPCT in the last 72 hours. Urine analysis:    Component Value Date/Time   COLORURINE AMBER (A) 08/20/2017 0124   APPEARANCEUR CLOUDY (A) 08/20/2017 0124   LABSPEC 1.016 08/20/2017 0124   PHURINE 5.0 08/20/2017 0124   GLUCOSEU 50 (A) 08/20/2017 0124  HGBUR MODERATE (A) 08/20/2017 0124   BILIRUBINUR NEGATIVE 08/20/2017 0124   KETONESUR 20 (A) 08/20/2017 0124   PROTEINUR 100 (A) 08/20/2017 0124   NITRITE  NEGATIVE 08/20/2017 0124   LEUKOCYTESUR NEGATIVE 08/20/2017 0124     Radiology Studies: Reviewed images personally in health database    Scheduled Meds: . buprenorphine-naloxone  1 tablet Sublingual BID  . Chlorhexidine Gluconate Cloth  6 each Topical Q0600  . feeding supplement (ENSURE ENLIVE)  237 mL Oral Q1500  . folic acid  1 mg Oral Daily  . [START ON 08/25/2017] furosemide  20 mg Oral Daily  . multivitamins with iron  1 tablet Oral Daily  . nicotine  21 mg Transdermal Daily  . pantoprazole  40 mg Oral BID  . thiamine injection  100 mg Intravenous Daily   Continuous Infusions:   LOS: 5 days    Time spent: [place], MD [facility] ([phone]   If 7PM-7AM, please contact night-coverage [email or web address] Password [facility] 08/24/2017, 7:19 AM

## 2017-08-25 DIAGNOSIS — R0603 Acute respiratory distress: Secondary | ICD-10-CM

## 2017-08-25 LAB — CBC
HCT: 31.3 % — ABNORMAL LOW (ref 39.0–52.0)
Hemoglobin: 10.6 g/dL — ABNORMAL LOW (ref 13.0–17.0)
MCH: 34.5 pg — AB (ref 26.0–34.0)
MCHC: 33.9 g/dL (ref 30.0–36.0)
MCV: 102 fL — ABNORMAL HIGH (ref 78.0–100.0)
PLATELETS: 66 10*3/uL — AB (ref 150–400)
RBC: 3.07 MIL/uL — AB (ref 4.22–5.81)
RDW: 16.1 % — ABNORMAL HIGH (ref 11.5–15.5)
WBC: 8.4 10*3/uL (ref 4.0–10.5)

## 2017-08-25 LAB — COMPREHENSIVE METABOLIC PANEL
ALT: 81 U/L — ABNORMAL HIGH (ref 17–63)
ANION GAP: 3 — AB (ref 5–15)
AST: 106 U/L — ABNORMAL HIGH (ref 15–41)
Albumin: 2.3 g/dL — ABNORMAL LOW (ref 3.5–5.0)
Alkaline Phosphatase: 59 U/L (ref 38–126)
BILIRUBIN TOTAL: 3.7 mg/dL — AB (ref 0.3–1.2)
BUN: 14 mg/dL (ref 6–20)
CHLORIDE: 99 mmol/L — AB (ref 101–111)
CO2: 30 mmol/L (ref 22–32)
Calcium: 8 mg/dL — ABNORMAL LOW (ref 8.9–10.3)
Creatinine, Ser: 0.75 mg/dL (ref 0.61–1.24)
Glucose, Bld: 97 mg/dL (ref 65–99)
POTASSIUM: 3.5 mmol/L (ref 3.5–5.1)
Sodium: 132 mmol/L — ABNORMAL LOW (ref 135–145)
TOTAL PROTEIN: 4.7 g/dL — AB (ref 6.5–8.1)

## 2017-08-25 LAB — PROTIME-INR
INR: 1.05
PROTHROMBIN TIME: 13.6 s (ref 11.4–15.2)

## 2017-08-25 MED ORDER — FUROSEMIDE 40 MG PO TABS
40.0000 mg | ORAL_TABLET | Freq: Every day | ORAL | Status: DC
  Administered 2017-08-25 – 2017-08-26 (×2): 40 mg via ORAL
  Filled 2017-08-25 (×2): qty 1

## 2017-08-25 NOTE — Progress Notes (Signed)
PROGRESS NOTE    Seth Obrien  VOJ:[id number] DOB: [birth year] DOA: 08/19/2017 PCP: Patient, No Pcp Per   Specialists:     Brief Narrative:  30  Chr pain on suboxone etoh mild use Admit 9/9 with 2 /52 h/o abd pain, some n/v, also green diarr  hypotensive on admit with anasarca hsm palpated by CCM admitting MD  Found to have on admit profound met acidosis, CO2 <7, sodium 113 LDH 422, Ammonia 54, Bili 8.8, Lactic acid >17, LDH 422, K=5.7 CT abd= hsm  initially placed on CRRT by nephrology and kidney fucntion recovered so they s/o 9/13 GI saw patient--placed initally by CCM on NAC, AI panel done-Vit k given-Following  Suspected to be MODS-transiently ischemic     Assessment & Plan:   Active Problems:   Acute hepatic failure   Metabolic acidosis   Acute hepatic failure Predominant bilirubinemia with elevated LDH on admission  wonder if this was a toxic exposure versus some unspecified virus + myocarditis that we just  have not caught  GI has seen and do not think that liver biopsy is necessary at present  Platelets 50-->66   Bilirubin 6-->4 -->3.7  If PLT close to 90-100 and Bili lower in am will d/c home with close follow up c GI  Acute renal failure, earlier in admission on CRRT  Stable at present time liberalize salt diet and allow heart healthy   Lasix by mouth 20 mg 9/15 AM-->give another r1 time dose 40 9/16 am  ? Myocarditis  Troponins trending down no further workup needed   No family Likely d/c am  Follow labs  Consultants:   Gastroenterology  Procedures:   None  Antimicrobials:   None    Subjective:  Weak Pleasant had shower antsy wishing to go home   Objective: Vitals:   08/24/17 0613 08/24/17 1459 08/24/17 2201 08/25/17 0531  BP: 126/81 121/75 135/80 125/77  Pulse: 80 78 94 80  Resp: _0 Temp: 98.4 F (36.9 C) 99.1 F (37.3 C) 99.1 F (37.3 C) 98 F (36.7 C)  TempSrc: Oral Oral Oral Oral  SpO2: 98% 99% 99% 96%    Weight:    81.8 kg (180 lb 4.8 oz)  Height:        Intake/Output Summary (Last 24 hours) at 08/25/17 1401 Last data filed at 08/25/17 1008  Gross per 24 hour  Intake              480 ml  Output             1100 ml  Net             -620 ml   Filed Weights   08/23/17 1808 08/24/17 0500 08/25/17 0531  Weight: 83.2 kg (183 lb 8 oz) 83.5 kg (184 lb 1.6 oz) 81.8 kg (180 lb 4.8 oz)    Examination:  EOMI NCAT Icteric +  Mucosa moist Chest is  clear no added sound No tremor or flap Grade 3 LE edema  Data Reviewed: I have personally reviewed following labs and imaging studies  CBC:  Recent Labs Lab 08/19/17 1030  08/21/17 0350 08/22/17 0402 08/23/17 0349 08/24/17 0555 08/25/17 0535  WBC 15.5*  < > 17.7* 13.2* 11.3* 8.9 8.4  NEUTROABS 12.8*  --   --   --   --   --   --   HGB 12.2*  < > 11.1* 11.5* 11.5* 10.6* 10.6*  HCT 34.9*  < > 31.4* 33.9*  33.2* 31.5* 31.3*  MCV 98.0  < > 96.0 97.7 99.7 100.6* 102.0*  PLT 348  < > 148* 75* 53* 53* 66*  < > = values in this interval not displayed. Basic Metabolic Panel:  Recent Labs Lab 08/20/17 0322  08/21/17 0350  08/21/17 1619 08/22/17 0402 08/22/17 1419 08/23/17 0349 08/24/17 0555 08/25/17 0535  NA 120*  < > 123*  < > 127* 129* 129* 128* 130* 132*  K 4.5  < > 3.7  --  3.4* 3.1* 3.1* 3.3* 3.2* 3.5  CL 72*  < > 81*  --  86* 92* 92* 94* 95* 99*  CO2 24  < > 25  --  _0 GLUCOSE 110*  < > 112*  --  132* 138* 159* 107* 89 97  BUN 70*  < > 68*  --  56* 42* 37* 33* 20 14  CREATININE 6.03*  < > 4.90*  --  3.39* 2.11* 1.67* 1.19 0.89 0.75  CALCIUM 8.8*  < > 7.9*  --  8.0* 7.8* 8.1* 7.9* 7.9* 8.0*  MG 1.7  --  1.9  --   --  2.1  --  2.0 1.7  --   PHOS 6.0*  < > 5.2*  --  4.3 3.5 2.7 2.2* 3.1  --   < > = values in this interval not displayed. GFR: Estimated Creatinine Clearance: 138.1 mL/min (by C-G formula based on SCr of 0.75 mg/dL). Liver Function Tests:  Recent Labs Lab 08/21/17 0828  08/22/17 0402  08/22/17 1419 08/23/17 0349 08/24/17 0555 08/25/17 0535  AST 271*  --  217*  --  165* 138* 106*  ALT 107*  --  102*  --  94* 90* 81*  ALKPHOS 50  --  61  --  65 57 59  BILITOT 6.9*  --  6.0*  --  5.4* 4.4* 3.7*  PROT 5.1*  --  5.0*  --  4.7* 4.6* 4.7*  ALBUMIN 2.5*  < > 2.3*  2.3* 2.4* 2.2* 2.3* 2.3*  < > = values in this interval not displayed.  Recent Labs Lab 08/19/17 1030  LIPASE 84*    Recent Labs Lab 08/19/17 1240  AMMONIA 54*   Coagulation Profile:  Recent Labs Lab 08/19/17 1240 08/20/17 0322 08/21/17 0828 08/22/17 0402 08/25/17 0535  INR 2.53 2.24 1.71 1.37 1.05   Cardiac Enzymes:  Recent Labs Lab 08/20/17 1045 08/20/17 1400 08/20/17 1545 08/20/17 2003 08/21/17 0828  CKTOTAL  --  477*  --   --   --   TROPONINI 19.34*  --  19.74* 14.07* 7.41*   BNP (last 3 results) No results for input(s): PROBNP in the last 8760 hours. HbA1C: No results for input(s): HGBA1C in the last 72 hours. CBG:  Recent Labs Lab 08/21/17 0321 08/21/17 0756 08/21/17 1142 08/22/17 0014 08/22/17 0410  GLUCAP 108* 112* 113* 168* 140*   Lipid Profile: No results for input(s): CHOL, HDL, LDLCALC, TRIG, CHOLHDL, LDLDIRECT in the last 72 hours. Thyroid Function Tests: No results for input(s): TSH, T4TOTAL, FREET4, T3FREE, THYROIDAB in the last 72 hours. Anemia Panel: No results for input(s): VITAMINB12, FOLATE, FERRITIN, TIBC, IRON, RETICCTPCT in the last 72 hours. Urine analysis:    Component Value Date/Time   COLORURINE AMBER (A) 08/20/2017 0124   APPEARANCEUR CLOUDY (A) 08/20/2017 0124   LABSPEC 1.016 08/20/2017 0124   PHURINE 5.0 08/20/2017 0124   GLUCOSEU 50 (A) 08/20/2017 0124   HGBUR MODERATE (A) 08/20/2017 0124  BILIRUBINUR NEGATIVE 08/20/2017 0124   KETONESUR 20 (A) 08/20/2017 0124   PROTEINUR 100 (A) 08/20/2017 0124   NITRITE NEGATIVE 08/20/2017 0124   LEUKOCYTESUR NEGATIVE 08/20/2017 0124     Radiology Studies: Reviewed images personally in health  database    Scheduled Meds: . buprenorphine-naloxone  1 tablet Sublingual BID  . Chlorhexidine Gluconate Cloth  6 each Topical Q0600  . feeding supplement (ENSURE ENLIVE)  237 mL Oral Q1500  . folic acid  1 mg Oral Daily  . furosemide  20 mg Oral Daily  . multivitamins with iron  1 tablet Oral Daily  . nicotine  21 mg Transdermal Daily  . pantoprazole  40 mg Oral BID  . thiamine  100 mg Oral Daily   Continuous Infusions:   LOS: 6 days    Time spent: [place], MD [facility] ([phone]   If 7PM-7AM, please contact night-coverage [email or web address] Password [email or web address] 08/25/2017, 2:01 PM

## 2017-08-26 LAB — PROTIME-INR
INR: 1
PROTHROMBIN TIME: 13.1 s (ref 11.4–15.2)

## 2017-08-26 LAB — COMPREHENSIVE METABOLIC PANEL
ALBUMIN: 2.4 g/dL — AB (ref 3.5–5.0)
ALT: 68 U/L — AB (ref 17–63)
ALT: 72 U/L — ABNORMAL HIGH (ref 17–63)
AST: 85 U/L — AB (ref 15–41)
AST: 85 U/L — AB (ref 15–41)
Albumin: 2.5 g/dL — ABNORMAL LOW (ref 3.5–5.0)
Alkaline Phosphatase: 57 U/L (ref 38–126)
Alkaline Phosphatase: 64 U/L (ref 38–126)
Anion gap: 8 (ref 5–15)
Anion gap: 5 (ref 5–15)
BILIRUBIN TOTAL: 3.4 mg/dL — AB (ref 0.3–1.2)
BUN: 12 mg/dL (ref 6–20)
BUN: 12 mg/dL (ref 6–20)
CHLORIDE: 101 mmol/L (ref 101–111)
CO2: 24 mmol/L (ref 22–32)
CO2: 28 mmol/L (ref 22–32)
CREATININE: 0.63 mg/dL (ref 0.61–1.24)
CREATININE: 0.65 mg/dL (ref 0.61–1.24)
Calcium: 8.1 mg/dL — ABNORMAL LOW (ref 8.9–10.3)
Calcium: 8.1 mg/dL — ABNORMAL LOW (ref 8.9–10.3)
Chloride: 100 mmol/L — ABNORMAL LOW (ref 101–111)
GFR calc Af Amer: >60 mL/min (ref >60)
GFR calc non Af Amer: >60 mL/min (ref >60)
GLUCOSE: 88 mg/dL (ref 65–99)
Glucose, Bld: 127 mg/dL — ABNORMAL HIGH (ref 65–99)
POTASSIUM: 3.6 mmol/L (ref 3.5–5.1)
Potassium: 3.8 mmol/L (ref 3.5–5.1)
SODIUM: 133 mmol/L — AB (ref 135–145)
Sodium: 133 mmol/L — ABNORMAL LOW (ref 135–145)
TOTAL PROTEIN: 5.2 g/dL — AB (ref 6.5–8.1)
Total Bilirubin: 3.4 mg/dL — ABNORMAL HIGH (ref 0.3–1.2)
Total Protein: 5 g/dL — ABNORMAL LOW (ref 6.5–8.1)

## 2017-08-26 LAB — CBC
HCT: 31 % — ABNORMAL LOW (ref 39.0–52.0)
HEMOGLOBIN: 10.6 g/dL — AB (ref 13.0–17.0)
MCH: 34.8 pg — AB (ref 26.0–34.0)
MCHC: 34.2 g/dL (ref 30.0–36.0)
MCV: 101.6 fL — ABNORMAL HIGH (ref 78.0–100.0)
PLATELETS: 96 10*3/uL — AB (ref 150–400)
RBC: 3.05 MIL/uL — AB (ref 4.22–5.81)
RDW: 15.8 % — ABNORMAL HIGH (ref 11.5–15.5)
WBC: 9.4 10*3/uL (ref 4.0–10.5)

## 2017-08-26 LAB — DIFFERENTIAL
BASOS ABS: 0 10*3/uL (ref 0.0–0.1)
Basophils Relative: 0 %
EOS ABS: 0.2 10*3/uL (ref 0.0–0.7)
Eosinophils Relative: 3 %
LYMPHS ABS: 1.4 10*3/uL (ref 0.7–4.0)
Lymphocytes Relative: 15 %
MONO ABS: 1.4 10*3/uL — AB (ref 0.1–1.0)
MONOS PCT: 14 %
Neutro Abs: 6.4 10*3/uL (ref 1.7–7.7)
Neutrophils Relative %: 68 %

## 2017-08-26 MED ORDER — NICOTINE 21 MG/24HR TD PT24: 21.0000 mg | MEDICATED_PATCH | Freq: Every day | TRANSDERMAL | 0 refills | Status: DC

## 2017-08-26 NOTE — Discharge Summary (Signed)
Physician Discharge Summary  Seth Obrien HKV:[id number] DOB: [birth year] DOA: 08/19/2017  PCP: Patient, No Pcp Per  Admit date: 08/19/2017 Discharge date: 08/26/2017  Time spent: 25 minutes  Recommendations for Outpatient Follow-up:  1. Get INR, cemented, CBC in 1 week 2. Follow-up with gastroenterology as an outpatient I have given you the name 3. 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 mild use Admit 9/9 with 2 /52 h/o abd pain, some n/v, also green diarr  hypotensive on admit with anasarca hsm palpated by CCM admitting MD  Found to have on admit profound met acidosis, CO2 <7, sodium 113 LDH 422, Ammonia 54, Bili 8.8, Lactic acid >17, LDH 422, K=5.7 CT abd= hsm  initially placed on CRRT by nephrology and kidney fucntion recovered so they s/o 9/13 GI saw patient--placed initally by CCM on NAC, AI panel done-Vit k given-Following  Suspected to be MODS-transiently ischemic  Hospital Course:   Acute hepatic failure Predominant bilirubinemia with elevated LDH on admission             wonder if this was a toxic exposure versus some unspecified virus + myocarditis that we just     have not caught             GI has seen and do not think that liver biopsy is necessary at present             Platelets 50-->66              Bilirubin 6-->4 -->3.7             If PLT close to 90-100 and Bili lower in am will d/c home with close follow up c GI  Acute renal failure, earlier in admission on CRRT             Stable at present time liberalize salt diet and allow heart  healthy              Lasix by mouth 20 mg 9/15 AM-->give another r1 time dose 40 9/16 am  ? Myocarditis             Troponins trending down no further workup needed  Parvovirus titers are pending on discharge and will need to be followed up                Consultants:   Gastroenterology  Procedures:   Echocardiogram EF 56-43% without diastolic dysfunction  Antimicrobials:   Multiple   Discharge Exam: Vitals:   08/25/17 2127 08/26/17 0521  BP: 123/72 129/90  Pulse: 86 92  Resp: 16 18  Temp: 99.1 F (37.3 C) 99.1 F (37.3 C)  SpO2: 98% 97%    Alert pleasant oriented less icteric  EOMI Abdomen soft nontender no rebound or guarding Lower extremity still grade 1 lower extremity edema   Discharge Instructions   Discharge Instructions    Diet - low sodium heart healthy    Complete by:  As directed    Discharge instructions    Complete by:  As directed  Get labs in 1 week at a PCP office--we will provide you with a list of PCP's in the area that you can follow with Will give you a note for work and excuse from the same Have filled out yout FMLA You will need follow-up labs as an outpatient   Increase activity slowly    Complete by:  As directed      Current Discharge Medication List    START taking these medications   Details  nicotine (NICODERM CQ - DOSED IN MG/24 HOURS) 21 mg/24hr patch Place 1 patch (21 mg total) onto the skin daily. Qty: 28 patch, Refills: 0      CONTINUE these medications which have NOT CHANGED   Details  Buprenorphine HCl-Naloxone HCl (SUBOXONE) 8-2 MG FILM Place 1 Film under the tongue 2 (two) times daily.    omeprazole (PRILOSEC OTC) 20 MG tablet Take 20 mg by mouth daily as needed (for acid reflux).      STOP taking these medications     acetaminophen (TYLENOL) 500 MG tablet        No Known Allergies Follow-up Information    Arta Silence, MD. Go in 10 day(s).   Specialty:  Gastroenterology Contact  information: [address] [place] [address] [phone]            The results of significant diagnostics from this hospitalization (including imaging, microbiology, ancillary and laboratory) are listed below for reference.    Significant Diagnostic Studies: Ct Abdomen Pelvis Wo Contrast  Result Date: 08/19/2017 CLINICAL DATA:  New onset jaundice to sclera, swelling ankles. Shortness of breath. EXAM: CT CHEST, ABDOMEN AND PELVIS WITHOUT CONTRAST TECHNIQUE: Multidetector CT imaging of the chest, abdomen and pelvis was performed following the standard protocol without IV contrast. COMPARISON:  None. FINDINGS: CT CHEST FINDINGS Cardiovascular: Heart size is upper normal. No pericardial effusion. No thoracic aortic aneurysm. Mediastinum/Nodes: No mass or enlarged lymph nodes seen within the mediastinum or perihilar regions. Esophagus is unremarkable. Trachea and central bronchi are unremarkable. Lungs/Pleura: Small right pleural effusion. Lungs are otherwise clear. No pneumonia or pulmonary edema. No pneumothorax. Musculoskeletal: No acute or significant osseous findings. CT ABDOMEN PELVIS FINDINGS Hepatobiliary: Liver is diffusely/markedly low in density suggesting fatty infiltration, alternatively edematous related to hepatitis. No focal liver mass or lesion seen. Gallbladder is unremarkable.  No bile duct dilatation. Pancreas: Unremarkable. No pancreatic ductal dilatation or surrounding inflammatory changes. Spleen: Normal in size without focal abnormality. Adrenals/Urinary Tract: Adrenal glands are unremarkable. Kidneys are unremarkable without mass, stone or hydronephrosis. Bladder is decompressed. Stomach/Bowel: Bowel is normal in caliber. No bowel wall thickening or evidence of bowel wall inflammation. Vascular/Lymphatic: No significant vascular findings are present. No enlarged abdominal or pelvic lymph nodes. Reproductive: Prostate is unremarkable. Other: Small amount of free  fluid and fluid stranding within the lower pelvis. No circumscribed fluid collection or abscess like collection. No free fluid appreciated within the upper abdomen. Musculoskeletal: Ill-defined fluid/edema throughout the superficial soft tissues indicating anasarca. Associated edematous scrotal wall thickening. No acute or significant osseous finding. IMPRESSION: 1. Liver is diffusely/markedly low in density suggesting either fatty infiltration or edema related to hepatitis or other infectious/inflammatory process. There is associated hepatomegaly (right liver lobe measures 21 cm length). No focal mass or lesion appreciated within the liver. 2. Gallbladder is unremarkable.  No bile duct dilatation. 3. Anasarca. 4. Scrotal wall thickening (edematous), likely related to the anasarca. 5. Small amount of free fluid/fluid stranding in the lower pelvis. No abscess collection.  6. Small right pleural effusion. Lungs otherwise clear. No pneumonia or pulmonary edema. Electronically Signed   By: Franki Cabot M.D.   On: 08/19/2017 12:17   Ct Chest Wo Contrast  Result Date: 08/19/2017 CLINICAL DATA:  New onset jaundice to sclera, swelling ankles. Shortness of breath. EXAM: CT CHEST, ABDOMEN AND PELVIS WITHOUT CONTRAST TECHNIQUE: Multidetector CT imaging of the chest, abdomen and pelvis was performed following the standard protocol without IV contrast. COMPARISON:  None. FINDINGS: CT CHEST FINDINGS Cardiovascular: Heart size is upper normal. No pericardial effusion. No thoracic aortic aneurysm. Mediastinum/Nodes: No mass or enlarged lymph nodes seen within the mediastinum or perihilar regions. Esophagus is unremarkable. Trachea and central bronchi are unremarkable. Lungs/Pleura: Small right pleural effusion. Lungs are otherwise clear. No pneumonia or pulmonary edema. No pneumothorax. Musculoskeletal: No acute or significant osseous findings. CT ABDOMEN PELVIS FINDINGS Hepatobiliary: Liver is diffusely/markedly low in density  suggesting fatty infiltration, alternatively edematous related to hepatitis. No focal liver mass or lesion seen. Gallbladder is unremarkable.  No bile duct dilatation. Pancreas: Unremarkable. No pancreatic ductal dilatation or surrounding inflammatory changes. Spleen: Normal in size without focal abnormality. Adrenals/Urinary Tract: Adrenal glands are unremarkable. Kidneys are unremarkable without mass, stone or hydronephrosis. Bladder is decompressed. Stomach/Bowel: Bowel is normal in caliber. No bowel wall thickening or evidence of bowel wall inflammation. Vascular/Lymphatic: No significant vascular findings are present. No enlarged abdominal or pelvic lymph nodes. Reproductive: Prostate is unremarkable. Other: Small amount of free fluid and fluid stranding within the lower pelvis. No circumscribed fluid collection or abscess like collection. No free fluid appreciated within the upper abdomen. Musculoskeletal: Ill-defined fluid/edema throughout the superficial soft tissues indicating anasarca. Associated edematous scrotal wall thickening. No acute or significant osseous finding. IMPRESSION: 1. Liver is diffusely/markedly low in density suggesting either fatty infiltration or edema related to hepatitis or other infectious/inflammatory process. There is associated hepatomegaly (right liver lobe measures 21 cm length). No focal mass or lesion appreciated within the liver. 2. Gallbladder is unremarkable.  No bile duct dilatation. 3. Anasarca. 4. Scrotal wall thickening (edematous), likely related to the anasarca. 5. Small amount of free fluid/fluid stranding in the lower pelvis. No abscess collection. 6. Small right pleural effusion. Lungs otherwise clear. No pneumonia or pulmonary edema. Electronically Signed   By: Franki Cabot M.D.   On: 08/19/2017 12:17   Dg Chest Port 1 View  Result Date: 08/21/2017 CLINICAL DATA:  Respiratory failure. EXAM: PORTABLE CHEST 1 VIEW COMPARISON:  08/20/2017. FINDINGS: Right IJ  line, left subclavian line in stable position. Mediastinum hilar structures normal. Cardiomegaly with normal pulmonary vascularity. No focal infiltrate. No pleural effusion or pneumothorax IMPRESSION: 1. Lines tubes in stable position. 2. Stable cardiomegaly.  No pulmonary venous congestion . Electronically Signed   By: Marcello Moores  Register   On: 08/21/2017 07:22   Dg Chest Port 1 View  Result Date: 08/20/2017 CLINICAL DATA:  Encounter for hemodialysis EXAM: PORTABLE CHEST 1 VIEW COMPARISON:  08/19/2017 FINDINGS: Left subclavian central line remains in place with the tip in the SVC. Interval placement of right internal jugular Vas-Cath with the tip in the SVC. No pneumothorax. Heart is upper limits normal in size. Lungs are clear. No effusions. No acute bony abnormality. IMPRESSION: Right dialysis catheter tip in the SVC.  No pneumothorax. Electronically Signed   By: Rolm Baptise M.D.   On: 08/20/2017 15:00   Dg Chest Port 1 View  Result Date: 08/19/2017 CLINICAL DATA:  Patient status post central line placement. EXAM: PORTABLE CHEST 1 VIEW  COMPARISON:  CT 08/19/2017 FINDINGS: Central venous catheter is present with tip projecting over the superior vena cava. Monitoring leads overlie the patient. Stable cardiac and mediastinal contours. No consolidative pulmonary opacities. No pleural effusion or pneumothorax. IMPRESSION: Central venous catheter tip projects over the superior vena cava. Cardiomegaly. No acute cardiopulmonary process. Electronically Signed   By: Lovey Newcomer M.D.   On: 08/19/2017 18:28   Dg Chest Portable 1 View  Result Date: 08/19/2017 CLINICAL DATA:  Patient with recent onset jaundice. EXAM: PORTABLE CHEST 1 VIEW COMPARISON:  None. FINDINGS: Monitoring leads overlie the patient. Cardiac contours upper limits of normal. No consolidative pulmonary opacities. No pleural effusion or pneumothorax. IMPRESSION: Mild cardiomegaly.  No acute cardiopulmonary process. Electronically Signed   By: Lovey Newcomer M.D.   On: 08/19/2017 10:54   US Liver Doppler  Result Date: 08/20/2017 CLINICAL DATA:  Acute liver failure. EXAM: DUPLEX ULTRASOUND OF LIVER TECHNIQUE: Color and duplex Doppler ultrasound was performed to evaluate the hepatic in-flow and out-flow vessels. COMPARISON:  None. FINDINGS: Portal Vein Velocities Main:  24.4 cm/sec Right:  16.8 cm/sec Left:  9.8 cm/sec Normal hepatopetal flow is noted in the portal veins. Hepatic Vein Velocities Right:  29.1 cm/sec Middle:  Not visualized. Left:  36.9 cm/sec Normal hepatofugal flow is noted in visualized hepatic veins. Hepatic Artery Velocity:  96.3 cm/sec Splenic Vein Velocity:  14.4 cm/sec Varices: Absent. Ascites: Ascites is noted. Spleen appears small.  Left pleural effusion is noted. IMPRESSION: There is no evidence of portal or splenic venous thrombosis or occlusion. Middle hepatic vein is not visualized and therefore thrombosis of this structure cannot be excluded. The right and left hepatic veins are patent and demonstrate normal flow. Mild ascites is noted. Electronically Signed   By: Marijo Conception, M.D.   On: 08/20/2017 12:41   US Abdomen Limited Ruq  Result Date: 08/20/2017 CLINICAL DATA:  Jaundice. EXAM: ULTRASOUND ABDOMEN LIMITED RIGHT UPPER QUADRANT COMPARISON:  CT chest, abdomen and pelvis August 19, 2017 FINDINGS: Gallbladder: Small amount of layering gallbladder sludge without wall thickening or pericholecystic fluid. No sonographic Murphy's sign elicited. Common bile duct: Diameter: 4 mm Liver: Mild hepatomegaly at 20.6 cm. The liver is diffusely echogenic with bidirectional portal flow, portal veins are patent. No intrahepatic masses or biliary dilatation. Trace perihepatic ascites. IMPRESSION: 1. Hepatomegaly with increased echotexture seen with hepatic steatosis/hepatocellular disease. 2. Patent portal vein with bidirectional flow, seen with cirrhosis/hepatic fibrosis and heart failure. 3. Gallbladder sludge without acute  cholecystitis. Electronically Signed   By: Elon Alas M.D.   On: 08/20/2017 01:26    Microbiology: Recent Results (from the past 240 hour(s))  Culture, blood (Routine x 2)     Status: None   Collection Time: 08/19/17 10:46 AM  Result Value Ref Range Status   Specimen Description BLOOD RIGHT ANTECUBITAL  Final   Special Requests   Final    BOTTLES DRAWN AEROBIC AND ANAEROBIC Blood Culture adequate volume   Culture NO GROWTH 5 DAYS  Final   Report Status 08/24/2017 FINAL  Final  MRSA PCR Screening     Status: None   Collection Time: 08/19/17  2:16 PM  Result Value Ref Range Status   MRSA by PCR NEGATIVE NEGATIVE Final    Comment:        The GeneXpert MRSA Assay (FDA approved for NASAL specimens only), is one component of a comprehensive MRSA colonization surveillance program. It is not intended to diagnose MRSA infection nor to guide or monitor treatment  for MRSA infections.   Culture, blood (Routine x 2)     Status: None   Collection Time: 08/19/17  2:32 PM  Result Value Ref Range Status   Specimen Description BLOOD LEFT ANTECUBITAL  Final   Special Requests   Final    BOTTLES DRAWN AEROBIC ONLY Blood Culture results may not be optimal due to an inadequate volume of blood received in culture bottles   Culture NO GROWTH 5 DAYS  Final   Report Status 08/24/2017 FINAL  Final     Labs: Basic Metabolic Panel:  Recent Labs Lab 08/20/17 0322  08/21/17 0350  08/21/17 1619 08/22/17 0402 08/22/17 1419 08/23/17 0349 08/24/17 0555 08/25/17 0535 08/26/17 0706 08/26/17 1117  NA 120*  < > 123*  < > 127* 129* 129* 128* 130* 132* 133* 133*  K 4.5  < > 3.7  --  3.4* 3.1* 3.1* 3.3* 3.2* 3.5 3.8 3.6  CL 72*  < > 81*  --  86* 92* 92* 94* 95* 99* 101 100*  CO2 24  < > 25  --  26 28 28 29 31 30 24 28   GLUCOSE 110*  < > 112*  --  132* 138* 159* 107* 89 97 88 127*  BUN 70*  < > 68*  --  56* 42* 37* 33* 20 14 12 12   CREATININE 6.03*  < > 4.90*  --  3.39* 2.11* 1.67* 1.19 0.89  0.75 0.63 0.65  CALCIUM 8.8*  < > 7.9*  --  8.0* 7.8* 8.1* 7.9* 7.9* 8.0* 8.1* 8.1*  MG 1.7  --  1.9  --   --  2.1  --  2.0 1.7  --   --   --   PHOS 6.0*  < > 5.2*  --  4.3 3.5 2.7 2.2* 3.1  --   --   --   < > = values in this interval not displayed. Liver Function Tests:  Recent Labs Lab 08/23/17 0349 08/24/17 0555 08/25/17 0535 08/26/17 0706 08/26/17 1117  AST 165* 138* 106* 85* 85*  ALT 94* 90* 81* 68* 72*  ALKPHOS 65 57 59 57 64  BILITOT 5.4* 4.4* 3.7* 3.4* 3.4*  PROT 4.7* 4.6* 4.7* 5.0* 5.2*  ALBUMIN 2.2* 2.3* 2.3* 2.4* 2.5*   No results for input(s): LIPASE, AMYLASE in the last 168 hours. No results for input(s): AMMONIA in the last 168 hours. CBC:  Recent Labs Lab 08/22/17 0402 08/23/17 0349 08/24/17 0555 08/25/17 0535 08/26/17 0706  WBC 13.2* 11.3* 8.9 8.4 9.4  NEUTROABS  --   --   --   --  6.4  HGB 11.5* 11.5* 10.6* 10.6* 10.6*  HCT 33.9* 33.2* 31.5* 31.3* 31.0*  MCV 97.7 99.7 100.6* 102.0* 101.6*  PLT 75* 53* 53* 66* 96*   Cardiac Enzymes:  Recent Labs Lab 08/20/17 1045 08/20/17 1400 08/20/17 1545 08/20/17 2003 08/21/17 0828  CKTOTAL  --  477*  --   --   --   TROPONINI 19.34*  --  19.74* 14.07* 7.41*   BNP: BNP (last 3 results)  Recent Labs  08/19/17 1030  BNP 3,252.8*    ProBNP (last 3 results) No results for input(s): PROBNP in the last 8760 hours.  CBG:  Recent Labs Lab 08/21/17 0321 08/21/17 0756 08/21/17 1142 08/22/17 0014 08/22/17 0410  GLUCAP 108* 112* 113* 168* 140*       Signed:  Nita Sells MD   [facility] 08/26/2017, 1:17 PM

## 2017-08-27 LAB — PARVOVIRUS B19 ANTIBODY, IGG AND IGM
PAROVIRUS B19 IGG ABS: 6.8 {index} — AB (ref 0.0–0.8)
Parovirus B19 IgM Abs: 0.1 index (ref 0.0–0.8)

## 2017-08-28 ENCOUNTER — Emergency Department (HOSPITAL_COMMUNITY): Payer: PRIVATE HEALTH INSURANCE

## 2017-08-28 ENCOUNTER — Emergency Department (HOSPITAL_COMMUNITY)
Admission: EM | Admit: 2017-08-28 | Discharge: 2017-08-28 | Disposition: A | Discharge status: Home / Self Care | Payer: PRIVATE HEALTH INSURANCE | Attending: Emergency Medicine | Admitting: Emergency Medicine

## 2017-08-28 DIAGNOSIS — R600 Localized edema: Secondary | ICD-10-CM | POA: Insufficient documentation

## 2017-08-28 DIAGNOSIS — R531 Weakness: Secondary | ICD-10-CM | POA: Diagnosis not present

## 2017-08-28 DIAGNOSIS — R509 Fever, unspecified: Secondary | ICD-10-CM

## 2017-08-28 DIAGNOSIS — R52 Pain, unspecified: Secondary | ICD-10-CM

## 2017-08-28 DIAGNOSIS — Z79899 Other long term (current) drug therapy: Secondary | ICD-10-CM | POA: Diagnosis not present

## 2017-08-28 DIAGNOSIS — F17210 Nicotine dependence, cigarettes, uncomplicated: Secondary | ICD-10-CM | POA: Insufficient documentation

## 2017-08-28 DIAGNOSIS — J189 Pneumonia, unspecified organism: Secondary | ICD-10-CM

## 2017-08-28 LAB — CBC WITH DIFFERENTIAL/PLATELET
Basophils Absolute: 0 10*3/uL (ref 0.0–0.1)
Basophils Relative: 0 %
EOS ABS: 0.1 10*3/uL (ref 0.0–0.7)
Eosinophils Relative: 1 %
HCT: 33.1 % — ABNORMAL LOW (ref 39.0–52.0)
Hemoglobin: 11.2 g/dL — ABNORMAL LOW (ref 13.0–17.0)
Lymphocytes Relative: 10 %
Lymphs Abs: 1 10*3/uL (ref 0.7–4.0)
MCH: 34.5 pg — ABNORMAL HIGH (ref 26.0–34.0)
MCHC: 33.8 g/dL (ref 30.0–36.0)
MCV: 101.8 fL — ABNORMAL HIGH (ref 78.0–100.0)
MONO ABS: 1.3 10*3/uL — AB (ref 0.1–1.0)
MONOS PCT: 13 %
Neutro Abs: 7.5 10*3/uL (ref 1.7–7.7)
Neutrophils Relative %: 76 %
Platelets: 205 10*3/uL (ref 150–400)
RBC: 3.25 MIL/uL — ABNORMAL LOW (ref 4.22–5.81)
RDW: 14.6 % (ref 11.5–15.5)
WBC: 10 10*3/uL (ref 4.0–10.5)

## 2017-08-28 LAB — COMPREHENSIVE METABOLIC PANEL
ALBUMIN: 2.7 g/dL — AB (ref 3.5–5.0)
ALK PHOS: 59 U/L (ref 38–126)
ALT: 54 U/L (ref 17–63)
ANION GAP: 7 (ref 5–15)
AST: 56 U/L — AB (ref 15–41)
BILIRUBIN TOTAL: 2.6 mg/dL — AB (ref 0.3–1.2)
BUN: 8 mg/dL (ref 6–20)
CALCIUM: 8.3 mg/dL — AB (ref 8.9–10.3)
CO2: 24 mmol/L (ref 22–32)
Chloride: 102 mmol/L (ref 101–111)
Creatinine, Ser: 0.64 mg/dL (ref 0.61–1.24)
GFR calc Af Amer: >60 mL/min (ref >60)
GFR calc non Af Amer: >60 mL/min (ref >60)
GLUCOSE: 132 mg/dL — AB (ref 65–99)
Potassium: 3.7 mmol/L (ref 3.5–5.1)
Sodium: 133 mmol/L — ABNORMAL LOW (ref 135–145)
Total Protein: 6 g/dL — ABNORMAL LOW (ref 6.5–8.1)

## 2017-08-28 LAB — RAPID URINE DRUG SCREEN, HOSP PERFORMED
AMPHETAMINES: NOT DETECTED
BARBITURATES: NOT DETECTED
Benzodiazepines: POSITIVE — AB
COCAINE: NOT DETECTED
OPIATES: NOT DETECTED
TETRAHYDROCANNABINOL: NOT DETECTED

## 2017-08-28 LAB — LIPASE, BLOOD: LIPASE: 24 U/L (ref 11–51)

## 2017-08-28 LAB — SEDIMENTATION RATE: Sed Rate: 28 mm/hr — ABNORMAL HIGH (ref 0–16)

## 2017-08-28 LAB — URINALYSIS, ROUTINE W REFLEX MICROSCOPIC
Bilirubin Urine: NEGATIVE
GLUCOSE, UA: 150 mg/dL — AB
HGB URINE DIPSTICK: NEGATIVE
KETONES UR: NEGATIVE mg/dL
Leukocytes, UA: NEGATIVE
Nitrite: NEGATIVE
PROTEIN: NEGATIVE mg/dL
Specific Gravity, Urine: 1.013 (ref 1.005–1.030)
pH: 7 (ref 5.0–8.0)

## 2017-08-28 LAB — PROTIME-INR
INR: 0.93
PROTHROMBIN TIME: 12.4 s (ref 11.4–15.2)

## 2017-08-28 LAB — TROPONIN I: Troponin I: 0.11 ng/mL (ref <0.03)

## 2017-08-28 LAB — I-STAT CG4 LACTIC ACID, ED: Lactic Acid, Venous: 1.17 mmol/L (ref 0.5–1.9)

## 2017-08-28 MED ORDER — LEVOFLOXACIN 750 MG PO TABS: 750.0000 mg | ORAL_TABLET | Freq: Every day | ORAL | 0 refills | Status: DC

## 2017-08-28 NOTE — ED Triage Notes (Addendum)
To ED for eval of fever. Pt recently discharged after an extended stay in ICU - kidney and liver failure- sent home and last pm noticed fever returned. Pt wife gave pt  Tylenol after speaking with GI. Pt states he hurst all over his body. Alert but appears weak.

## 2017-08-28 NOTE — ED Provider Notes (Signed)
[facility]-EMERGENCY DEPT Provider Note   CSN: [id number] Arrival date & time: 08/28/17  1201     History   Chief Complaint Chief Complaint  Patient presents with  . Fever    HPI Seth Obrien is a 31 y.o. male.  HPI Patient developed fever yesterday evening up to 100.8.today he had fever up to 100.6 and took 325 mg Tylenol after approval by gastroenterology. Patient had been hospitalized and was severely ill with discharge 2 days ago. Patient reports he continues to feel very generally weak and achy although it does not seem worse than it had been at onset of his illness.. The pain continues to be generalized. He reports he is eating quite a bit. No vomiting or diarrhea. Very mild amount of cough.No increased localizing right upper quadrant pain. Past Medical History:  Diagnosis Date  . Eczema   . Heartburn     Patient Active Problem List   Diagnosis Date Noted  . Metabolic acidosis   . Acute hepatic failure 08/19/2017    No past surgical history on file.     Home Medications    Prior to Admission medications   Medication Sig Start Date End Date Taking? Authorizing Provider  Buprenorphine HCl-Naloxone HCl (SUBOXONE) 8-2 MG FILM Place 1 Film under the tongue 2 (two) times daily.   Yes [provider]  Multiple Vitamin (MULTIVITAMIN WITH MINERALS) TABS tablet Take 1 tablet by mouth daily.   Yes [provider]  omeprazole (PRILOSEC OTC) 20 MG tablet Take 20 mg by mouth daily as needed (for acid reflux).   Yes [provider]  levofloxacin (LEVAQUIN) 750 MG tablet Take 1 tablet (750 mg total) by mouth daily. X 7 days 08/28/17   Arby Barrette, MD  nicotine (NICODERM CQ - DOSED IN MG/24 HOURS) 21 mg/24hr patch Place 1 patch (21 mg total) onto the skin daily. 08/26/17   Rhetta Mura, MD    Family History No family history on file.  Social History Social History  Substance Use Topics  . Smoking status: Current Every Day Smoker   Packs/day: 1.00    Years: 15.00  . Smokeless tobacco: Never Used  . Alcohol use Yes     Comment: 3-4 drinks a day     Allergies   Patient has no known allergies.   Review of Systems Review of Systems 10 Systems reviewed and are negative for acute change except as noted in the HPI.   Physical Exam Updated Vital Signs BP (!) 145/103   Pulse 97   Temp 99.8 F (37.7 C) (Oral)   Resp 14   Ht  (1.753 m)   Wt 74.8 kg (165 lb)   SpO2 100%   BMI 24.37 kg/m   Physical Exam  Constitutional: He is oriented to person, place, and time.  Patient is alert and nontoxic. No respiratory distress. He continues to be slightly jaundiced.  HENT:  Head: Normocephalic and atraumatic.  Nose: Nose normal.  Mouth/Throat: Oropharynx is clear and moist.  Eyes: Pupils are equal, round, and reactive to light. EOM are normal.  Persistent mild scleral icterus.  Neck: Neck supple.  Cardiovascular:  Borderline tachycardia. No rub murmur gallop.  Pulmonary/Chest: Effort normal. No respiratory distress. He has no wheezes. He has no rales.  Occasional cough with deep inspiration.  Abdominal: Soft. He exhibits no distension. There is no tenderness. There is no guarding.  Musculoskeletal: Normal range of motion.  Patient has mild edema of both feet. This does not extend much  beyond the ankle.Both calves are soft and nontender.  Neurological: He is alert and oriented to person, place, and time. No cranial nerve deficit. He exhibits normal muscle tone. Coordination normal.  Skin: Skin is warm and dry.  Psychiatric: He has a normal mood and affect.     ED Treatments / Results  Labs (all labs ordered are listed, but only abnormal results are displayed) Labs Reviewed  COMPREHENSIVE METABOLIC PANEL - Abnormal; Notable for the following:       Result Value   Sodium 133 (*)    Glucose, Bld 132 (*)    Calcium 8.3 (*)    Total Protein 6.0 (*)    Albumin 2.7 (*)    AST 56 (*)    Total Bilirubin 2.6  (*)    All other components within normal limits  CBC WITH DIFFERENTIAL/PLATELET - Abnormal; Notable for the following:    RBC 3.25 (*)    Hemoglobin 11.2 (*)    HCT 33.1 (*)    MCV 101.8 (*)    MCH 34.5 (*)    Monocytes Absolute 1.3 (*)    All other components within normal limits  URINALYSIS, ROUTINE W REFLEX MICROSCOPIC - Abnormal; Notable for the following:    Glucose, UA 150 (*)    All other components within normal limits  TROPONIN I - Abnormal; Notable for the following:    Troponin I 0.11 (*)    All other components within normal limits  CULTURE, BLOOD (ROUTINE X 2)  CULTURE, BLOOD (ROUTINE X 2)  PROTIME-INR  LIPASE, BLOOD  SEDIMENTATION RATE  RAPID URINE DRUG SCREEN, HOSP PERFORMED  BRAIN NATRIURETIC PEPTIDE  I-STAT CG4 LACTIC ACID, ED  I-STAT CG4 LACTIC ACID, ED  I-STAT CG4 LACTIC ACID, ED    EKG  EKG Interpretation  Date/Time:  Tuesday August 28 2017 12:46:25 EDT Ventricular Rate:  94 PR Interval:    QRS Duration: 88 QT Interval:  364 QTC Calculation: 456 R Axis:   94 Text Interpretation:  Sinus rhythm Left atrial enlargement Borderline right axis deviation Borderline repolarization abnormality consistent with previous with some increased peaking V3 V4. inferior T wave dynamic and now upright. Confirmed by Arby Barrette [phone]) on 08/28/2017 1:58:17 PM       Radiology Dg Chest Port 1 View  Result Date: 08/28/2017 CLINICAL DATA:  Fever.  Smoking history. EXAM: PORTABLE CHEST 1 VIEW COMPARISON:  08/21/2017 .  CT 08/19/2017 . FINDINGS: Interval removal of right IJ and left subclavian lines . Mediastinum and hilar structures normal. Mild cardiomegaly. New onset of scratched bilateral pulmonary infiltrates suggesting pulmonary edema. Bilateral pneumonia cannot be excluded. Small left pleural effusion. No pneumothorax. IMPRESSION: 1. An removal of right IJ and left subclavian central lines. No pneumothorax. 2. New onset of bilateral pulmonary infiltrates  suggesting pulmonary edema. Bilateral pneumonia cannot be excluded. Small left pleural effusion. Electronically Signed   By: Maisie Fus  Register   On: 08/28/2017 13:12    Procedures Procedures (including critical care time)  Medications Ordered in ED Medications - No data to display   Initial Impression / Assessment and Plan / ED Course  I have reviewed the triage vital signs and the nursing notes.  Pertinent labs & imaging results that were available during my care of the patient were reviewed by me and considered in my medical decision making (see chart for details).    Consultt: Discussed with hospitalist reviewed by Dr. Margot Ables. Advises at this time with no leukocytosis, no lactic acidosis, no fever patient is  appropriate for outpatient initiation of Levaquin her chest x-ray findings. Consult: Dr. Ninetta Lights of infectious disease recommends Levaquin for coverage of chest x-ray findings suggestive of pneumonia. Can see the patient in follow-up at his clinic.  Final Clinical Impressions(s) / ED Diagnoses   Final diagnoses:  HCAP (healthcare-associated pneumonia)  Fever, unspecified fever cause  patient was recently discharged from the hospital for complicated course of acute hepatic and renal failure. At home, fever has been measured up to 100.8 and 100.6. Patient is nontoxic and alert. He does not have respiratory distress. Vital signs are stable. At this time, patient does not have leukocytosis or lactic acidosis. Hematologic results are normalizing with patient's platelets back in the normal range and LFTs continuing to improve. Patient will be initiated on outpatient Levaquin for chest x-ray findings. At this time, there are no signs of acute congestive heart failure. Patient is not dyspneic and edema has been improving. Patient and his wife are counseled on immediate follow-up should his condition deteriorate in any way including elevated fever, shortness of breath, chest pain or other  concerning symptoms. They are agreeable with plan and I feel reliable for expeditious return and follow-up.  New Prescriptions New Prescriptions   LEVOFLOXACIN (LEVAQUIN) 750 MG TABLET    Take 1 tablet (750 mg total) by mouth daily. X 7 days     Arby Barrette, MD 08/28/17 [id number]

## 2017-09-02 LAB — CULTURE, BLOOD (ROUTINE X 2)
Culture: NO GROWTH
Culture: NO GROWTH
SPECIAL REQUESTS: ADEQUATE
SPECIAL REQUESTS: ADEQUATE

## 2017-09-03 ENCOUNTER — Encounter: Payer: Self-pay | Admitting: Family Medicine

## 2017-09-03 ENCOUNTER — Ambulatory Visit (INDEPENDENT_AMBULATORY_CARE_PROVIDER_SITE_OTHER): Payer: PRIVATE HEALTH INSURANCE | Admitting: Family Medicine

## 2017-09-03 VITALS — BP 117/79 | HR 111 | Temp 99.4°F | Ht 70.0 in | Wt 149.4 lb

## 2017-09-03 DIAGNOSIS — E872 Acidosis, unspecified: Secondary | ICD-10-CM

## 2017-09-03 DIAGNOSIS — K7200 Acute and subacute hepatic failure without coma: Secondary | ICD-10-CM

## 2017-09-03 NOTE — Progress Notes (Signed)
New patient office visit note:  Impression and Recommendations:    1. Acute liver failure without hepatic coma   2. Metabolic acidosis      Acute liver failure without hepatic coma  Metabolic acidosis   No problem-specific Assessment & Plan notes found for this encounter.   The patient was counseled, risk factors were discussed, anticipatory guidance given.   New Prescriptions   No medications on file    No orders of the defined types were placed in this encounter.   Discontinued Medications   LEVOFLOXACIN (LEVAQUIN) 750 MG TABLET    Take 1 tablet (750 mg total) by mouth daily. X 7 days   NICOTINE (NICODERM CQ - DOSED IN MG/24 HOURS) 21 MG/24HR PATCH    Place 1 patch (21 mg total) onto the skin daily.   OMEPRAZOLE (PRILOSEC OTC) 20 MG TABLET    Take 20 mg by mouth daily as needed (for acid reflux).    Modified Medications   No medications on file    No orders of the defined types were placed in this encounter.    Gross side effects, risk and benefits, and alternatives of medications discussed with patient.  Patient is aware that all medications have potential side effects and we are unable to predict every side effect or drug-drug interaction that may occur.  Expresses verbal understanding and consents to current therapy plan and treatment regimen.  Return for Fasting bldwrk-near future then OV  wtih me in couple mo- reck GI .  Please see AVS handed out to patient at the end of our visit for further patient instructions/ counseling done pertaining to today's office visit.    Note: This document was prepared using Dragon voice recognition software and may include unintentional dictation errors.  ----------------------------------------------------------------------------------------------------------------------    Subjective:    Chief complaint:   Chief Complaint  Patient presents with  . Establish Care     HPI: Seth Obrien is a pleasant 31  y.o. male who presents to [place] [facility] today to review their medical history with me and establish care.   I asked the patient to review their chronic problem list with me to ensure everything was updated and accurate.    All recent office visits with other providers, any medical records that patient brought in etc  - I reviewed today.     Also asked pt to get me medical records from [facility] providers/ specialists that they had seen within the past 3-5 years- if they are in private practice and/or do not work for a [employer], [facility], [place], [facility] or [employer] owned practice.  Told them to call their specialists to clarify this if they are not sure.   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     Eoth- use moderate--> 3-4 hard ciders daily and more on weekends. None since hosp.  Has f/up with Dr Paulita Fujita Oct 1st.    1/2 pack per day 10 yrs.      Problem  Metabolic Acidosis  Acute Hepatic Failure      Wt Readings from Last 3 Encounters:  09/03/17 149 lb 6.4 oz (67.8 kg)  08/28/17 165 lb (74.8 kg)  08/25/17 180 lb 4.8 oz (81.8 kg)   BP Readings from Last 3 Encounters:  09/03/17 117/79  08/28/17 (!) 143/100  08/26/17 129/90   Pulse Readings from Last 3 Encounters:  09/03/17 (!) 111  08/28/17 83  08/26/17 92   BMI Readings from Last 3 Encounters:  09/03/17 21.44 kg/m  08/28/17 24.37 kg/m  08/25/17 25.87 kg/m    Patient Care Team    Relationship Specialty Notifications Start End  Mellody Dance, DO PCP - General Family Medicine  09/03/17   Arta Silence, MD Consulting Physician Gastroenterology  09/03/17     Patient Active Problem List   Diagnosis Date Noted  . Metabolic acidosis   . Acute hepatic failure 08/19/2017     Past Medical History:  Diagnosis Date  . Eczema   . Heartburn       Past Medical History:  Diagnosis Date  . Eczema   . Heartburn      History reviewed. No pertinent surgical history.   Family History  Problem Relation Age of Onset  . Cancer Maternal Grandmother        breast  . Diabetes Paternal Grandmother   . Heart disease Paternal Grandmother   . Heart disease Paternal Grandfather      History  Drug Use No    Comment: suboxone     History  Alcohol Use  . Yes    Comment: 3-4 drinks a day     History  Smoking Status  . Current Every Day Smoker  . Packs/day: 1.50  . Years: 15.00  Smokeless Tobacco  . Never Used     Outpatient Encounter Prescriptions as of 09/03/2017  Medication Sig  . Buprenorphine HCl-Naloxone HCl (SUBOXONE) 8-2 MG FILM Place 1 Film under the tongue 2 (two) times daily.  . Multiple Vitamin (MULTIVITAMIN WITH MINERALS) TABS tablet Take 1 tablet by mouth daily.  . [DISCONTINUED] levofloxacin (LEVAQUIN) 750 MG tablet Take 1 tablet (750 mg total) by mouth daily. X 7 days  . [DISCONTINUED] nicotine (NICODERM CQ - DOSED IN MG/24 HOURS) 21 mg/24hr patch Place 1 patch (21 mg total) onto the skin daily.  . [DISCONTINUED] omeprazole (PRILOSEC OTC) 20 MG tablet Take 20 mg by mouth daily as needed (for acid reflux).   No facility-administered encounter medications on file as of 09/03/2017.     Allergies: Patient has no known allergies.   ROS   Objective:   Blood pressure 117/79, pulse (!) 111, temperature 99.4 F (37.4 C), height 5' 10"  (1.778 m), weight 149 lb 6.4 oz (67.8 kg). Body mass index is 21.44 kg/m. General: Well Developed, well nourished, and in no acute distress.  Neuro: Alert and oriented x3, extra-ocular muscles intact, sensation grossly intact.  HEENT:[place]/AT, PERRLA, neck supple, No carotid bruits Skin: no gross rashes  Cardiac: Regular rate and rhythm Respiratory: Essentially clear to auscultation bilaterally. Not using accessory muscles, speaking in full sentences.  Abdominal: not  grossly distended Musculoskeletal: Ambulates w/o diff, FROM * 4 ext.  Vasc: less 2 sec cap RF, warm and pink  Psych:  No HI/SI, judgement and insight good, Euthymic mood. Full Affect.    Recent Results (from the past 2160 hour(s))  Comprehensive metabolic panel     Status: Abnormal   Collection Time: 08/19/17 10:30 AM  Result Value Ref Range   Sodium 113 (LL) 135 - 145 mmol/L    Comment: CRITICAL RESULT CALLED TO, READ BACK BY AND VERIFIED WITH: NICKOLICHGRN [phone] [id number] MCCAULEG REPEATED TO VERIFY    Potassium 5.7 (H) 3.5 - 5.1 mmol/L   Chloride 69 (L) 101 - 111 mmol/L   CO2 <7 (L) 22 - 32  mmol/L    Comment: REPEATED TO VERIFY   Glucose, Bld 107 (H) 65 - 99 mg/dL   BUN 59 (H) 6 - 20 mg/dL   Creatinine, Ser 5.89 (H) 0.61 - 1.24 mg/dL   Calcium 9.9 8.9 - 10.3 mg/dL   Total Protein 6.7 6.5 - 8.1 g/dL   Albumin 3.4 (L) 3.5 - 5.0 g/dL   AST <5 (L) 15 - 41 U/L    Comment: REPEATED TO VERIFY   ALT <5 (L) 17 - 63 U/L    Comment: REPEATED TO VERIFY   Alkaline Phosphatase 60 38 - 126 U/L   Total Bilirubin 8.8 (H) 0.3 - 1.2 mg/dL   GFR calc non Af Amer 12 (L) >60 mL/min   GFR calc Af Amer 13 (L) >60 mL/min    Comment: (NOTE) The eGFR has been calculated using the CKD EPI equation. This calculation has not been validated in all clinical situations. eGFR's persistently <60 mL/min signify possible Chronic Kidney Disease.   CBC with Differential     Status: Abnormal   Collection Time: 08/19/17 10:30 AM  Result Value Ref Range   WBC 15.5 (H) 4.0 - 10.5 K/uL   RBC 3.56 (L) 4.22 - 5.81 MIL/uL   Hemoglobin 12.2 (L) 13.0 - 17.0 g/dL   HCT 34.9 (L) 39.0 - 52.0 %   MCV 98.0 78.0 - 100.0 fL   MCH 34.3 (H) 26.0 - 34.0 pg   MCHC 35.0 30.0 - 36.0 g/dL   RDW 13.4 11.5 - 15.5 %   Platelets 348 150 - 400 K/uL   Neutrophils Relative % 83 %   Neutro Abs 12.8 (H) 1.7 - 7.7 K/uL   Lymphocytes Relative 8 %   Lymphs Abs 1.3 0.7 - 4.0 K/uL   Monocytes Relative 9 %   Monocytes Absolute 1.5 (H)  0.1 - 1.0 K/uL   Eosinophils Relative 0 %   Eosinophils Absolute 0.0 0.0 - 0.7 K/uL   Basophils Relative 0 %   Basophils Absolute 0.0 0.0 - 0.1 K/uL  Protime-INR     Status: Abnormal   Collection Time: 08/19/17 10:30 AM  Result Value Ref Range   Prothrombin Time 26.1 (H) 11.4 - 15.2 seconds   INR 2.42   Lipase, blood     Status: Abnormal   Collection Time: 08/19/17 10:30 AM  Result Value Ref Range   Lipase 84 (H) 11 - 51 U/L  APTT     Status: Abnormal   Collection Time: 08/19/17 10:30 AM  Result Value Ref Range   aPTT 40 (H) 24 - 36 seconds    Comment:        IF BASELINE aPTT IS ELEVATED, SUGGEST PATIENT RISK ASSESSMENT BE USED TO DETERMINE APPROPRIATE ANTICOAGULANT THERAPY.   Brain natriuretic peptide     Status: Abnormal   Collection Time: 08/19/17 10:30 AM  Result Value Ref Range   B Natriuretic Peptide 3,252.8 (H) 0.0 - 100.0 pg/mL  Ethanol     Status: None   Collection Time: 08/19/17 10:30 AM  Result Value Ref Range   Alcohol, Ethyl (B) <5 <5 mg/dL    Comment:        LOWEST DETECTABLE LIMIT FOR SERUM ALCOHOL IS 5 mg/dL FOR MEDICAL PURPOSES ONLY   Acetaminophen level     Status: Abnormal   Collection Time: 08/19/17 10:30 AM  Result Value Ref Range   Acetaminophen (Tylenol), Serum <10 (L) 10 - 30 ug/mL    Comment:        THERAPEUTIC  CONCENTRATIONS VARY SIGNIFICANTLY. A RANGE OF 10-30 ug/mL MAY BE AN EFFECTIVE CONCENTRATION FOR MANY PATIENTS. HOWEVER, SOME ARE BEST TREATED AT CONCENTRATIONS OUTSIDE THIS RANGE. ACETAMINOPHEN CONCENTRATIONS >150 ug/mL AT 4 HOURS AFTER INGESTION AND >50 ug/mL AT 12 HOURS AFTER INGESTION ARE OFTEN ASSOCIATED WITH TOXIC REACTIONS.   ABO/Rh     Status: None   Collection Time: 08/19/17 10:41 AM  Result Value Ref Range   ABO/RH(D) A POS   Culture, blood (Routine x 2)     Status: None   Collection Time: 08/19/17 10:46 AM  Result Value Ref Range   Specimen Description BLOOD RIGHT ANTECUBITAL    Special Requests      BOTTLES  DRAWN AEROBIC AND ANAEROBIC Blood Culture adequate volume   Culture NO GROWTH 5 DAYS    Report Status 08/24/2017 FINAL   Type and screen     Status: None   Collection Time: 08/19/17 10:46 AM  Result Value Ref Range   ABO/RH(D) A POS    Antibody Screen NEG    Sample Expiration 08/22/2017   I-stat troponin, ED     Status: Abnormal   Collection Time: 08/19/17 11:14 AM  Result Value Ref Range   Troponin i, poc 0.27 (HH) 0.00 - 0.08 ng/mL   Comment NOTIFIED PHYSICIAN    Comment 3            Comment: Due to the release kinetics of cTnI, a negative result within the first hours of the onset of symptoms does not rule out myocardial infarction with certainty. If myocardial infarction is still suspected, repeat the test at appropriate intervals.   I-Stat CG4 Lactic Acid, ED     Status: Abnormal   Collection Time: 08/19/17 11:16 AM  Result Value Ref Range   Lactic Acid, Venous >17.00 (HH) 0.5 - 1.9 mmol/L   Comment NOTIFIED PHYSICIAN   Blood gas, arterial     Status: Abnormal   Collection Time: 08/19/17 11:30 AM  Result Value Ref Range   FIO2 100.00    Delivery systems NON-REBREATHER OXYGEN MASK    pH, Arterial 7.410 7.350 - 7.450   pCO2 arterial BELOW REPORTABLE RANGE 32.0 - 48.0 mmHg    Comment: CRITICAL RESULT CALLED TO, READ BACK BY AND VERIFIED WITH: A.HOLCOMB,RRT @ 1135 BY M.MACON,RRT ON 08/19/17 CORRECTED ON 09/11 AT 2563: PREVIOUSLY REPORTED AS CRITICAL RESULT CALLED TO, READ BACK BY AND VERIFIED WITH: BELOW REPORTABLE RANGE, ASHLEY HOLCOMB RRT,RCP AT [place] RRT,RCP ON 08/19/2017    pO2, Arterial 275 (H) 83.0 - 108.0 mmHg   Bicarbonate 5.6 (L) 20.0 - 28.0 mmol/L   Acid-base deficit 19.3 (H) 0.0 - 2.0 mmol/L   O2 Saturation 99.6 %   Patient temperature 97.4    Collection site BRACHIAL ARTERY    Drawn by [id number]    Sample type ARTERIAL DRAW   Lactate dehydrogenase     Status: Abnormal   Collection Time: 08/19/17 12:40 PM  Result Value Ref Range   LDH 422 (H) 98 -  192 U/L  Hepatitis panel, acute     Status: None   Collection Time: 08/19/17 12:40 PM  Result Value Ref Range   Hepatitis B Surface Ag Negative Negative   HCV Ab <0.1 0.0 - 0.9 s/co ratio    Comment: (NOTE)                                  Negative:     <  0.8                             Indeterminate: 0.8 - 0.9                                  Positive:     > 0.9 The CDC recommends that a positive HCV antibody result be followed up with a HCV Nucleic Acid Amplification test (544920). Performed At: [facility] [place], [place] [id number] Lindon Romp MD [id number]    Hep A IgM Negative Negative   Hep B C IgM Negative Negative  Bilirubin, direct     Status: Abnormal   Collection Time: 08/19/17 12:40 PM  Result Value Ref Range   Bilirubin, Direct 4.0 (H) 0.1 - 0.5 mg/dL  DIC panel     Status: Abnormal   Collection Time: 08/19/17 12:40 PM  Result Value Ref Range   Prothrombin Time 27.0 (H) 11.4 - 15.2 seconds   INR 2.53    aPTT 41 (H) 24 - 36 seconds    Comment:        IF BASELINE aPTT IS ELEVATED, SUGGEST PATIENT RISK ASSESSMENT BE USED TO DETERMINE APPROPRIATE ANTICOAGULANT THERAPY.    Fibrinogen 156 (L) 210 - 475 mg/dL   D-Dimer, Quant 1.44 (H) 0.00 - 0.50 ug/mL-FEU    Comment: (NOTE) At the manufacturer cut-off of 0.50 ug/mL FEU, this assay has been documented to exclude PE with a sensitivity and negative predictive value of 97 to 99%.  At this time, this assay has not been approved by the FDA to exclude DVT/VTE. Results should be correlated with clinical presentation.    Platelets 317 150 - 400 K/uL   Smear Review NO SCHISTOCYTES SEEN   Ammonia     Status: Abnormal   Collection Time: 08/19/17 12:40 PM  Result Value Ref Range   Ammonia 54 (H) 9 - 35 umol/L  HIV antibody (Routine Testing)     Status: None   Collection Time: 08/19/17 12:40 PM  Result Value Ref Range   HIV Screen 4th Generation wRfx Non Reactive Non Reactive     Comment: (NOTE) Performed At: [facility] [address] [place], [place] [id number] Lindon Romp MD WK:[id number]   I-Stat CG4 Lactic Acid, ED     Status: Abnormal   Collection Time: 08/19/17  2:04 PM  Result Value Ref Range   Lactic Acid, Venous >17.00 (HH) 0.5 - 1.9 mmol/L   Comment NOTIFIED PHYSICIAN   MRSA PCR Screening     Status: None   Collection Time: 08/19/17  2:16 PM  Result Value Ref Range   MRSA by PCR NEGATIVE NEGATIVE    Comment:        The GeneXpert MRSA Assay (FDA approved for NASAL specimens only), is one component of a comprehensive MRSA colonization surveillance program. It is not intended to diagnose MRSA infection nor to guide or monitor treatment for MRSA infections.   Basic metabolic panel     Status: Abnormal   Collection Time: 08/19/17  2:21 PM  Result Value Ref Range   Sodium 112 (LL) 135 - 145 mmol/L    Comment: CRITICAL RESULT CALLED TO, READ BACK BY AND VERIFIED WITH: RN A LEWIS AT 1540 [id number] MARTINB    Potassium 6.3 (HH) 3.5 - 5.1 mmol/L    Comment: SLIGHT HEMOLYSIS HEMOLYSIS AT THIS LEVEL  MAY AFFECT RESULT CRITICAL RESULT CALLED TO, READ BACK BY AND VERIFIED WITH: RN A LEWIS AT [id number] MARTINB    Chloride 68 (L) 101 - 111 mmol/L   CO2 8 (L) 22 - 32 mmol/L   Glucose, Bld 122 (H) 65 - 99 mg/dL   BUN 62 (H) 6 - 20 mg/dL   Creatinine, Ser 5.90 (H) 0.61 - 1.24 mg/dL   Calcium 9.6 8.9 - 10.3 mg/dL   GFR calc non Af Amer 12 (L) >60 mL/min   GFR calc Af Amer 13 (L) >60 mL/min    Comment: (NOTE) The eGFR has been calculated using the CKD EPI equation. This calculation has not been validated in all clinical situations. eGFR's persistently <60 mL/min signify possible Chronic Kidney Disease.    Anion gap 36 (H) 5 - 15    Comment: RESULT REPEATED AND VERIFIED  Magnesium     Status: None   Collection Time: 08/19/17  2:21 PM  Result Value Ref Range   Magnesium 2.0 1.7 - 2.4 mg/dL  Phosphorus     Status: Abnormal    Collection Time: 08/19/17  2:21 PM  Result Value Ref Range   Phosphorus 8.7 (H) 2.5 - 4.6 mg/dL  Osmolality     Status: Abnormal   Collection Time: 08/19/17  2:21 PM  Result Value Ref Range   Osmolality 268 (L) 275 - 295 mOsm/kg  Culture, blood (Routine x 2)     Status: None   Collection Time: 08/19/17  2:32 PM  Result Value Ref Range   Specimen Description BLOOD LEFT ANTECUBITAL    Special Requests      BOTTLES DRAWN AEROBIC ONLY Blood Culture results may not be optimal due to an inadequate volume of blood received in culture bottles   Culture NO GROWTH 5 DAYS    Report Status 08/24/2017 FINAL   Glucose, capillary     Status: Abnormal   Collection Time: 08/19/17  4:33 PM  Result Value Ref Range   Glucose-Capillary 150 (H) 65 - 99 mg/dL   Comment 1 Notify RN   I-STAT 3, arterial blood gas (G3+)     Status: Abnormal   Collection Time: 08/19/17  5:00 PM  Result Value Ref Range   pH, Arterial 7.537 (H) 7.350 - 7.450   pCO2 arterial 16.8 (LL) 32.0 - 48.0 mmHg   pO2, Arterial 109.0 (H) 83.0 - 108.0 mmHg   Bicarbonate 14.3 (L) 20.0 - 28.0 mmol/L   TCO2 15 (L) 22 - 32 mmol/L   O2 Saturation 99.0 %   Acid-base deficit 5.0 (H) 0.0 - 2.0 mmol/L   Patient temperature 36.7 C    Collection site RADIAL, ALLEN'S TEST ACCEPTABLE    Drawn by RT    Sample type ARTERIAL    Comment NOTIFIED PHYSICIAN   Basic metabolic panel     Status: Abnormal   Collection Time: 08/19/17  8:59 PM  Result Value Ref Range   Sodium 117 (LL) 135 - 145 mmol/L    Comment: CRITICAL RESULT CALLED TO, READ BACK BY AND VERIFIED WITH: [facility] RN 08/19/2017 2139 JORDANS    Potassium 4.5 3.5 - 5.1 mmol/L    Comment: DELTA CHECK NOTED   Chloride 70 (L) 101 - 111 mmol/L   CO2 22 22 - 32 mmol/L   Glucose, Bld 162 (H) 65 - 99 mg/dL   BUN 64 (H) 6 - 20 mg/dL   Creatinine, Ser 5.72 (H) 0.61 - 1.24 mg/dL   Calcium 8.8 (L) 8.9 - 10.3  mg/dL   GFR calc non Af Amer 12 (L) >60 mL/min   GFR calc Af Amer 14 (L) >60 mL/min     Comment: (NOTE) The eGFR has been calculated using the CKD EPI equation. This calculation has not been validated in all clinical situations. eGFR's persistently <60 mL/min signify possible Chronic Kidney Disease.    Anion gap 25 (H) 5 - 15    Comment: RESULT CHECKED  Glucose, capillary     Status: Abnormal   Collection Time: 08/19/17 11:49 PM  Result Value Ref Range   Glucose-Capillary 125 (H) 65 - 99 mg/dL   Comment 1 Notify RN   Urinalysis, Routine w reflex microscopic     Status: Abnormal   Collection Time: 08/20/17  1:24 AM  Result Value Ref Range   Color, Urine AMBER (A) YELLOW    Comment: BIOCHEMICALS MAY BE AFFECTED BY COLOR   APPearance CLOUDY (A) CLEAR   Specific Gravity, Urine 1.016 1.005 - 1.030   pH 5.0 5.0 - 8.0   Glucose, UA 50 (A) NEGATIVE mg/dL   Hgb urine dipstick MODERATE (A) NEGATIVE   Bilirubin Urine NEGATIVE NEGATIVE   Ketones, ur 20 (A) NEGATIVE mg/dL   Protein, ur 100 (A) NEGATIVE mg/dL   Nitrite NEGATIVE NEGATIVE   Leukocytes, UA NEGATIVE NEGATIVE   RBC / HPF 6-30 0 - 5 RBC/hpf   WBC, UA 6-30 0 - 5 WBC/hpf   Bacteria, UA RARE (A) NONE SEEN   Squamous Epithelial / LPF 0-5 (A) NONE SEEN   Mucus PRESENT    Hyaline Casts, UA PRESENT    Ca Oxalate Crys, UA PRESENT   Urine rapid drug screen (hosp performed)     Status: Abnormal   Collection Time: 08/20/17  1:24 AM  Result Value Ref Range   Opiates POSITIVE (A) NONE DETECTED   Cocaine NONE DETECTED NONE DETECTED   Benzodiazepines NONE DETECTED NONE DETECTED   Amphetamines NONE DETECTED NONE DETECTED   Tetrahydrocannabinol NONE DETECTED NONE DETECTED   Barbiturates NONE DETECTED NONE DETECTED    Comment:        DRUG SCREEN FOR MEDICAL PURPOSES ONLY.  IF CONFIRMATION IS NEEDED FOR ANY PURPOSE, NOTIFY LAB WITHIN 5 DAYS.        LOWEST DETECTABLE LIMITS FOR URINE DRUG SCREEN Drug Class       Cutoff (ng/mL) Amphetamine      1000 Barbiturate      200 Benzodiazepine   916 Tricyclics       945 Opiates           300 Cocaine          300 THC              50   Osmolality, urine     Status: Abnormal   Collection Time: 08/20/17  1:24 AM  Result Value Ref Range   Osmolality, Ur 299 (L) 300 - 900 mOsm/kg  Magnesium     Status: None   Collection Time: 08/20/17  3:22 AM  Result Value Ref Range   Magnesium 1.7 1.7 - 2.4 mg/dL  Phosphorus     Status: Abnormal   Collection Time: 08/20/17  3:22 AM  Result Value Ref Range   Phosphorus 6.0 (H) 2.5 - 4.6 mg/dL  Comprehensive metabolic panel     Status: Abnormal   Collection Time: 08/20/17  3:22 AM  Result Value Ref Range   Sodium 120 (L) 135 - 145 mmol/L   Potassium 4.5 3.5 - 5.1 mmol/L  Chloride 72 (L) 101 - 111 mmol/L   CO2 24 22 - 32 mmol/L   Glucose, Bld 110 (H) 65 - 99 mg/dL   BUN 70 (H) 6 - 20 mg/dL   Creatinine, Ser 6.03 (H) 0.61 - 1.24 mg/dL   Calcium 8.8 (L) 8.9 - 10.3 mg/dL   Total Protein 5.8 (L) 6.5 - 8.1 g/dL   Albumin 2.8 (L) 3.5 - 5.0 g/dL   AST 302 (H) 15 - 41 U/L   ALT 107 (H) 17 - 63 U/L   Alkaline Phosphatase 49 38 - 126 U/L   Total Bilirubin 7.6 (H) 0.3 - 1.2 mg/dL   GFR calc non Af Amer 11 (L) >60 mL/min   GFR calc Af Amer 13 (L) >60 mL/min    Comment: (NOTE) The eGFR has been calculated using the CKD EPI equation. This calculation has not been validated in all clinical situations. eGFR's persistently <60 mL/min signify possible Chronic Kidney Disease.    Anion gap 24 (H) 5 - 15  Protime-INR     Status: Abnormal   Collection Time: 08/20/17  3:22 AM  Result Value Ref Range   Prothrombin Time 24.6 (H) 11.4 - 15.2 seconds   INR 2.24   Glucose, capillary     Status: Abnormal   Collection Time: 08/20/17  3:50 AM  Result Value Ref Range   Glucose-Capillary 118 (H) 65 - 99 mg/dL   Comment 1 Notify RN   Blood gas, arterial     Status: Abnormal   Collection Time: 08/20/17  4:15 AM  Result Value Ref Range   FIO2 21.00    pH, Arterial 7.539 (H) 7.350 - 7.450   pCO2 arterial 29.3 (L) 32.0 - 48.0 mmHg   pO2,  Arterial 100 83.0 - 108.0 mmHg   Bicarbonate 24.9 20.0 - 28.0 mmol/L   Acid-Base Excess 2.3 (H) 0.0 - 2.0 mmol/L   O2 Saturation 97.7 %   Patient temperature 98.6    Collection site RIGHT RADIAL    Drawn by [phone]    Sample type ARTERIAL DRAW    Allens test (pass/fail) PASS PASS  Glucose, capillary     Status: Abnormal   Collection Time: 08/20/17  8:06 AM  Result Value Ref Range   Glucose-Capillary 107 (H) 65 - 99 mg/dL  Lactic acid, plasma     Status: Abnormal   Collection Time: 08/20/17 10:24 AM  Result Value Ref Range   Lactic Acid, Venous 2.6 (HH) 0.5 - 1.9 mmol/L    Comment: CRITICAL RESULT CALLED TO, READ BACK BY AND VERIFIED WITH: SHELTON,P RN @ 1141 08/20/17 LEONARD,A   CBC     Status: Abnormal   Collection Time: 08/20/17 10:45 AM  Result Value Ref Range   WBC 19.3 (H) 4.0 - 10.5 K/uL   RBC 3.63 (L) 4.22 - 5.81 MIL/uL   Hemoglobin 12.3 (L) 13.0 - 17.0 g/dL   HCT 34.7 (L) 39.0 - 52.0 %   MCV 95.6 78.0 - 100.0 fL   MCH 33.9 26.0 - 34.0 pg   MCHC 35.4 30.0 - 36.0 g/dL   RDW 13.8 11.5 - 15.5 %   Platelets 258 150 - 400 K/uL  Troponin I (q 6hr x 3)     Status: Abnormal   Collection Time: 08/20/17 10:45 AM  Result Value Ref Range   Troponin I 19.34 (HH) <0.03 ng/mL    Comment: CRITICAL RESULT CALLED TO, READ BACK BY AND VERIFIED WITH: TURNER,D RN @ 1140 08/20/17 LEONARD,A   ECHOCARDIOGRAM COMPLETE  Status: None   Collection Time: 08/20/17 10:57 AM  Result Value Ref Range   Weight 2,400 oz   Height 70 in   BP 102/74 mmHg  Glucose, capillary     Status: Abnormal   Collection Time: 08/20/17 11:37 AM  Result Value Ref Range   Glucose-Capillary 125 (H) 65 - 99 mg/dL   Comment 1 Capillary Specimen   Lactic acid, plasma     Status: Abnormal   Collection Time: 08/20/17  1:11 PM  Result Value Ref Range   Lactic Acid, Venous 2.1 (HH) 0.5 - 1.9 mmol/L    Comment: CRITICAL RESULT CALLED TO, READ BACK BY AND VERIFIED WITH: A.LEWIS RN @1502  09.10.18 SPIKESN   ANA w/Reflex  if Positive     Status: None   Collection Time: 08/20/17  2:00 PM  Result Value Ref Range   Anit Nuclear Antibody(ANA) Negative Negative    Comment: (NOTE) Performed At: [facility] [place], [place] [id number] Lindon Romp MD [id number]   C3 complement     Status: Abnormal   Collection Time: 08/20/17  2:00 PM  Result Value Ref Range   C3 Complement 55 (L) 82 - 167 mg/dL    Comment: (NOTE) Performed At: [facility] [place], [place] [id number] Lindon Romp MD [id number]   C4 complement     Status: None   Collection Time: 08/20/17  2:00 PM  Result Value Ref Range   Complement C4, Body Fluid 15 14 - 44 mg/dL    Comment: (NOTE) Performed At: [facility] [place], [place] [id number] Lindon Romp MD [id number]   Anti-smooth muscle antibody, IgG     Status: None   Collection Time: 08/20/17  2:00 PM  Result Value Ref Range   F-Actin IgG 10 0 - 19 Units    Comment: (NOTE)                 Negative                     0 - 19                 Weak positive               20 - 30                 Moderate to strong positive     >30 Actin Antibodies are found in 52-85% of patients with autoimmune hepatitis or chronic active hepatitis and in 22% of patients with primary biliary cirrhosis. Performed At: [facility] [place], [place] [id number] Lindon Romp MD SW:[id number]   AntiMicrosomal Ab-Liver / Kidney     Status: None   Collection Time: 08/20/17  2:00 PM  Result Value Ref Range   LKM1 Ab 2.0 0.0 - 20.0 Units    Comment: (NOTE)                                Negative    0.0 - 20.0                                Equivocal  20.1 - 24.9  Positive         >24.9 LKM type 1 antibodies are detected in patients with autoimmune hepatitis type 2 and in up to 8% of patients with chronic HCV infection. Performed At: [facility] [place], [place] [id number] Lindon Romp MD GQ:[id number]   CK     Status: Abnormal   Collection Time: 08/20/17  2:00 PM  Result Value Ref Range   Total CK 477 (H) 49 - 397 U/L  ANCA titers     Status: None   Collection Time: 08/20/17  2:00 PM  Result Value Ref Range   C-ANCA <1:20 Neg:<1:20 titer   P-ANCA <1:20 Neg:<1:20 titer    Comment: (NOTE) The presence of positive fluorescence exhibiting P-ANCA or C-ANCA patterns alone is not specific for the diagnosis of Wegener's Granulomatosis (WG) or microscopic polyangiitis. Decisions about treatment should not be based solely on ANCA IFA results.  The International ANCA Group Consensus recommends follow up testing of positive sera with both PR-3 and MPO-ANCA enzyme immunoassays. As many as 5% serum samples are positive only by EIA. Ref. AM J Clin Pathol 1999;111:507-513.    Atypical P-ANCA titer <1:20 Neg:<1:20 titer    Comment: (NOTE) The atypical pANCA pattern has been observed in a significant percentage of patients with ulcerative colitis, primary sclerosing cholangitis and autoimmune hepatitis. Performed At: [facility] [place], [place] [id number] Lindon Romp MD PH:[id number]   Troponin I     Status: Abnormal   Collection Time: 08/20/17  3:45 PM  Result Value Ref Range   Troponin I 19.74 (HH) <0.03 ng/mL    Comment: CRITICAL VALUE NOTED.  VALUE IS CONSISTENT WITH PREVIOUSLY REPORTED AND CALLED VALUE.  Renal function panel (daily at 1600)     Status: Abnormal   Collection Time: 08/20/17  3:45 PM  Result Value Ref Range   Sodium 121 (L) 135 - 145 mmol/L   Potassium 4.2 3.5 - 5.1 mmol/L   Chloride 73 (L) 101 - 111 mmol/L   CO2 22 22 - 32 mmol/L   Glucose, Bld 95 65 - 99 mg/dL   BUN 76 (H) 6 - 20 mg/dL   Creatinine, Ser 6.20 (H) 0.61 - 1.24 mg/dL   Calcium 8.1 (L) 8.9 - 10.3 mg/dL   Phosphorus 6.4 (H) 2.5 - 4.6 mg/dL   Albumin 2.6 (L) 3.5 - 5.0 g/dL   GFR calc non Af Amer 11 (L)  >60 mL/min   GFR calc Af Amer 13 (L) >60 mL/min    Comment: (NOTE) The eGFR has been calculated using the CKD EPI equation. This calculation has not been validated in all clinical situations. eGFR's persistently <60 mL/min signify possible Chronic Kidney Disease.    Anion gap 26 (H) 5 - 15  Glucose, capillary     Status: Abnormal   Collection Time: 08/20/17  3:51 PM  Result Value Ref Range   Glucose-Capillary 112 (H) 65 - 99 mg/dL   Comment 1 Capillary Specimen   Renal function panel     Status: Abnormal   Collection Time: 08/20/17  5:20 PM  Result Value Ref Range   Sodium 121 (L) 135 - 145 mmol/L   Potassium 4.1 3.5 - 5.1 mmol/L   Chloride 74 (L) 101 - 111 mmol/L   CO2 24 22 - 32 mmol/L   Glucose, Bld 107 (H) 65 - 99 mg/dL   BUN 74 (H) 6 - 20 mg/dL   Creatinine, Ser 6.05 (H) 0.61 - 1.24 mg/dL  Calcium 7.8 (L) 8.9 - 10.3 mg/dL   Phosphorus 6.3 (H) 2.5 - 4.6 mg/dL   Albumin 2.5 (L) 3.5 - 5.0 g/dL   GFR calc non Af Amer 11 (L) >60 mL/min   GFR calc Af Amer 13 (L) >60 mL/min    Comment: (NOTE) The eGFR has been calculated using the CKD EPI equation. This calculation has not been validated in all clinical situations. eGFR's persistently <60 mL/min signify possible Chronic Kidney Disease.    Anion gap 23 (H) 5 - 15  POCT Activated clotting time     Status: None   Collection Time: 08/20/17  6:11 PM  Result Value Ref Range   Activated Clotting Time 153 seconds  POCT Activated clotting time     Status: None   Collection Time: 08/20/17  7:04 PM  Result Value Ref Range   Activated Clotting Time 153 seconds  Troponin I     Status: Abnormal   Collection Time: 08/20/17  8:03 PM  Result Value Ref Range   Troponin I 14.07 (HH) <0.03 ng/mL    Comment: CRITICAL VALUE NOTED.  VALUE IS CONSISTENT WITH PREVIOUSLY REPORTED AND CALLED VALUE.  Sodium     Status: Abnormal   Collection Time: 08/20/17  8:03 PM  Result Value Ref Range   Sodium 121 (L) 135 - 145 mmol/L  Glucose, capillary      Status: None   Collection Time: 08/20/17  8:24 PM  Result Value Ref Range   Glucose-Capillary 97 65 - 99 mg/dL   Comment 1 Capillary Specimen   Protein / creatinine ratio, urine     Status: None   Collection Time: 08/20/17  9:27 PM  Result Value Ref Range   Creatinine, Urine 116.62 mg/dL   Total Protein, Urine <6 mg/dL    Comment: NO NORMAL RANGE ESTABLISHED FOR THIS TEST   Protein Creatinine Ratio        0.00 - 0.15 mg/mg[Cre]    Comment: RESULT BELOW REPORTABLE RANGE, UNABLE TO CALCULATE.   Glucose, capillary     Status: Abnormal   Collection Time: 08/20/17 11:44 PM  Result Value Ref Range   Glucose-Capillary 134 (H) 65 - 99 mg/dL   Comment 1 Capillary Specimen    Comment 2 Notify RN   Glucose, capillary     Status: Abnormal   Collection Time: 08/21/17  3:21 AM  Result Value Ref Range   Glucose-Capillary 108 (H) 65 - 99 mg/dL   Comment 1 Capillary Specimen    Comment 2 Notify RN   CBC     Status: Abnormal   Collection Time: 08/21/17  3:50 AM  Result Value Ref Range   WBC 17.7 (H) 4.0 - 10.5 K/uL   RBC 3.27 (L) 4.22 - 5.81 MIL/uL   Hemoglobin 11.1 (L) 13.0 - 17.0 g/dL   HCT 31.4 (L) 39.0 - 52.0 %   MCV 96.0 78.0 - 100.0 fL   MCH 33.9 26.0 - 34.0 pg   MCHC 35.4 30.0 - 36.0 g/dL   RDW 13.9 11.5 - 15.5 %   Platelets 148 (L) 150 - 400 K/uL  Magnesium     Status: None   Collection Time: 08/21/17  3:50 AM  Result Value Ref Range   Magnesium 1.9 1.7 - 2.4 mg/dL  Renal function panel (daily at 0500)     Status: Abnormal   Collection Time: 08/21/17  3:50 AM  Result Value Ref Range   Sodium 123 (L) 135 - 145 mmol/L   Potassium 3.7 3.5 -  5.1 mmol/L   Chloride 81 (L) 101 - 111 mmol/L   CO2 25 22 - 32 mmol/L   Glucose, Bld 112 (H) 65 - 99 mg/dL   BUN 68 (H) 6 - 20 mg/dL   Creatinine, Ser 4.90 (H) 0.61 - 1.24 mg/dL   Calcium 7.9 (L) 8.9 - 10.3 mg/dL   Phosphorus 5.2 (H) 2.5 - 4.6 mg/dL   Albumin 2.5 (L) 3.5 - 5.0 g/dL   GFR calc non Af Amer 14 (L) >60 mL/min   GFR calc  Af Amer 17 (L) >60 mL/min    Comment: (NOTE) The eGFR has been calculated using the CKD EPI equation. This calculation has not been validated in all clinical situations. eGFR's persistently <60 mL/min signify possible Chronic Kidney Disease.    Anion gap 17 (H) 5 - 15  Glucose, capillary     Status: Abnormal   Collection Time: 08/21/17  7:56 AM  Result Value Ref Range   Glucose-Capillary 112 (H) 65 - 99 mg/dL   Comment 1 Capillary Specimen    Comment 2 Notify RN   Hepatic function panel     Status: Abnormal   Collection Time: 08/21/17  8:28 AM  Result Value Ref Range   Total Protein 5.1 (L) 6.5 - 8.1 g/dL   Albumin 2.5 (L) 3.5 - 5.0 g/dL   AST 271 (H) 15 - 41 U/L   ALT 107 (H) 17 - 63 U/L   Alkaline Phosphatase 50 38 - 126 U/L   Total Bilirubin 6.9 (H) 0.3 - 1.2 mg/dL    Comment: ICTERIC SPECIMEN   Bilirubin, Direct 3.9 (H) 0.1 - 0.5 mg/dL   Indirect Bilirubin 3.0 (H) 0.3 - 0.9 mg/dL  Troponin I     Status: Abnormal   Collection Time: 08/21/17  8:28 AM  Result Value Ref Range   Troponin I 7.41 (HH) <0.03 ng/mL    Comment: CRITICAL VALUE NOTED.  VALUE IS CONSISTENT WITH PREVIOUSLY REPORTED AND CALLED VALUE.  Lactic acid, plasma     Status: None   Collection Time: 08/21/17  8:28 AM  Result Value Ref Range   Lactic Acid, Venous 1.3 0.5 - 1.9 mmol/L  Protime-INR     Status: Abnormal   Collection Time: 08/21/17  8:28 AM  Result Value Ref Range   Prothrombin Time 20.0 (H) 11.4 - 15.2 seconds   INR 1.71   Glucose, capillary     Status: Abnormal   Collection Time: 08/21/17 11:42 AM  Result Value Ref Range   Glucose-Capillary 113 (H) 65 - 99 mg/dL   Comment 1 Capillary Specimen    Comment 2 Notify RN   Sodium     Status: Abnormal   Collection Time: 08/21/17 12:30 PM  Result Value Ref Range   Sodium 125 (L) 135 - 145 mmol/L  Renal function panel (daily at 1600)     Status: Abnormal   Collection Time: 08/21/17  4:19 PM  Result Value Ref Range   Sodium 127 (L) 135 - 145  mmol/L   Potassium 3.4 (L) 3.5 - 5.1 mmol/L   Chloride 86 (L) 101 - 111 mmol/L   CO2 26 22 - 32 mmol/L   Glucose, Bld 132 (H) 65 - 99 mg/dL   BUN 56 (H) 6 - 20 mg/dL   Creatinine, Ser 3.39 (H) 0.61 - 1.24 mg/dL   Calcium 8.0 (L) 8.9 - 10.3 mg/dL   Phosphorus 4.3 2.5 - 4.6 mg/dL   Albumin 2.4 (L) 3.5 - 5.0 g/dL   GFR  calc non Af Amer 23 (L) >60 mL/min   GFR calc Af Amer 26 (L) >60 mL/min    Comment: (NOTE) The eGFR has been calculated using the CKD EPI equation. This calculation has not been validated in all clinical situations. eGFR's persistently <60 mL/min signify possible Chronic Kidney Disease.    Anion gap 15 5 - 15  Glucose, capillary     Status: Abnormal   Collection Time: 08/22/17 12:14 AM  Result Value Ref Range   Glucose-Capillary 168 (H) 65 - 99 mg/dL   Comment 1 Notify RN   Renal function panel (daily at 0500)     Status: Abnormal   Collection Time: 08/22/17  4:02 AM  Result Value Ref Range   Sodium 129 (L) 135 - 145 mmol/L   Potassium 3.1 (L) 3.5 - 5.1 mmol/L   Chloride 92 (L) 101 - 111 mmol/L   CO2 28 22 - 32 mmol/L   Glucose, Bld 138 (H) 65 - 99 mg/dL   BUN 42 (H) 6 - 20 mg/dL   Creatinine, Ser 2.11 (H) 0.61 - 1.24 mg/dL    Comment: DELTA CHECK NOTED   Calcium 7.8 (L) 8.9 - 10.3 mg/dL   Phosphorus 3.5 2.5 - 4.6 mg/dL   Albumin 2.3 (L) 3.5 - 5.0 g/dL   GFR calc non Af Amer 40 (L) >60 mL/min   GFR calc Af Amer 46 (L) >60 mL/min    Comment: (NOTE) The eGFR has been calculated using the CKD EPI equation. This calculation has not been validated in all clinical situations. eGFR's persistently <60 mL/min signify possible Chronic Kidney Disease.    Anion gap 9 5 - 15  CBC     Status: Abnormal   Collection Time: 08/22/17  4:02 AM  Result Value Ref Range   WBC 13.2 (H) 4.0 - 10.5 K/uL   RBC 3.47 (L) 4.22 - 5.81 MIL/uL   Hemoglobin 11.5 (L) 13.0 - 17.0 g/dL   HCT 33.9 (L) 39.0 - 52.0 %   MCV 97.7 78.0 - 100.0 fL   MCH 33.1 26.0 - 34.0 pg   MCHC 33.9 30.0 -  36.0 g/dL   RDW 14.5 11.5 - 15.5 %   Platelets 75 (L) 150 - 400 K/uL    Comment: REPEATED TO VERIFY SPECIMEN CHECKED FOR CLOTS PLATELET COUNT CONFIRMED BY SMEAR DELTA CHECK NOTED   Magnesium     Status: None   Collection Time: 08/22/17  4:02 AM  Result Value Ref Range   Magnesium 2.1 1.7 - 2.4 mg/dL  Hepatic function panel     Status: Abnormal   Collection Time: 08/22/17  4:02 AM  Result Value Ref Range   Total Protein 5.0 (L) 6.5 - 8.1 g/dL   Albumin 2.3 (L) 3.5 - 5.0 g/dL   AST 217 (H) 15 - 41 U/L   ALT 102 (H) 17 - 63 U/L   Alkaline Phosphatase 61 38 - 126 U/L   Total Bilirubin 6.0 (H) 0.3 - 1.2 mg/dL   Bilirubin, Direct 3.2 (H) 0.1 - 0.5 mg/dL   Indirect Bilirubin 2.8 (H) 0.3 - 0.9 mg/dL  Protime-INR     Status: Abnormal   Collection Time: 08/22/17  4:02 AM  Result Value Ref Range   Prothrombin Time 16.8 (H) 11.4 - 15.2 seconds   INR 1.37   Glucose, capillary     Status: Abnormal   Collection Time: 08/22/17  4:10 AM  Result Value Ref Range   Glucose-Capillary 140 (H) 65 - 99 mg/dL  Renal function panel (daily at 1600)     Status: Abnormal   Collection Time: 08/22/17  2:19 PM  Result Value Ref Range   Sodium 129 (L) 135 - 145 mmol/L   Potassium 3.1 (L) 3.5 - 5.1 mmol/L   Chloride 92 (L) 101 - 111 mmol/L   CO2 28 22 - 32 mmol/L   Glucose, Bld 159 (H) 65 - 99 mg/dL   BUN 37 (H) 6 - 20 mg/dL   Creatinine, Ser 1.67 (H) 0.61 - 1.24 mg/dL   Calcium 8.1 (L) 8.9 - 10.3 mg/dL   Phosphorus 2.7 2.5 - 4.6 mg/dL   Albumin 2.4 (L) 3.5 - 5.0 g/dL   GFR calc non Af Amer 53 (L) >60 mL/min   GFR calc Af Amer >60 >60 mL/min    Comment: (NOTE) The eGFR has been calculated using the CKD EPI equation. This calculation has not been validated in all clinical situations. eGFR's persistently <60 mL/min signify possible Chronic Kidney Disease.    Anion gap 9 5 - 15  CMV IgM     Status: None   Collection Time: 08/22/17  2:19 PM  Result Value Ref Range   CMV IgM <30.0 0.0 - 29.9 AU/mL      Comment: (NOTE)                                Negative         <30.0                                Equivocal  30.0 - 34.9                                Positive         >34.9 A positive result is generally indicative of acute infection, reactivation or persistent IgM production. Performed At: [facility] [place], [place] [id number] Lindon Romp MD [id number]   Epstein-Barr virus VCA, IgG     Status: Abnormal   Collection Time: 08/22/17  2:19 PM  Result Value Ref Range   EBV VCA IgG 97.4 (H) 0.0 - 17.9 U/mL    Comment: (NOTE)                                 Negative        <18.0                                 Equivocal 18.0 - 21.9                                 Positive        >21.9 Performed At: [facility] [address] [place], [place] [id number] Lindon Romp MD [id number]   Epstein-Barr virus VCA, IgM     Status: None   Collection Time: 08/22/17  2:19 PM  Result Value Ref Range   EBV VCA IgM <36.0 0.0 - 35.9 U/mL    Comment: (NOTE)  Negative        <36.0                                 Equivocal 36.0 - 43.9                                 Positive        >43.9 Performed At: [facility] [place], [place] [id number] Lindon Romp MD [id number]   RSV(respiratory syncytial virus) ab     Status: None   Collection Time: 08/22/17  2:19 PM  Result Value Ref Range   RSV Ab Negative Neg:<1:8    Comment: (NOTE) Performed At: [facility] [place], [place] [id number] Lindon Romp MD [id number]   CBC     Status: Abnormal   Collection Time: 08/23/17  3:49 AM  Result Value Ref Range   WBC 11.3 (H) 4.0 - 10.5 K/uL   RBC 3.33 (L) 4.22 - 5.81 MIL/uL   Hemoglobin 11.5 (L) 13.0 - 17.0 g/dL   HCT 33.2 (L) 39.0 - 52.0 %   MCV 99.7 78.0 - 100.0 fL   MCH 34.5 (H) 26.0 - 34.0 pg   MCHC 34.6 30.0 - 36.0 g/dL   RDW 15.5 11.5 - 15.5 %    Platelets 53 (L) 150 - 400 K/uL    Comment: REPEATED TO VERIFY CONSISTENT WITH PREVIOUS RESULT   Magnesium     Status: None   Collection Time: 08/23/17  3:49 AM  Result Value Ref Range   Magnesium 2.0 1.7 - 2.4 mg/dL  Phosphorus     Status: Abnormal   Collection Time: 08/23/17  3:49 AM  Result Value Ref Range   Phosphorus 2.2 (L) 2.5 - 4.6 mg/dL  Comprehensive metabolic panel     Status: Abnormal   Collection Time: 08/23/17  3:49 AM  Result Value Ref Range   Sodium 128 (L) 135 - 145 mmol/L   Potassium 3.3 (L) 3.5 - 5.1 mmol/L   Chloride 94 (L) 101 - 111 mmol/L   CO2 29 22 - 32 mmol/L   Glucose, Bld 107 (H) 65 - 99 mg/dL   BUN 33 (H) 6 - 20 mg/dL   Creatinine, Ser 1.19 0.61 - 1.24 mg/dL   Calcium 7.9 (L) 8.9 - 10.3 mg/dL   Total Protein 4.7 (L) 6.5 - 8.1 g/dL   Albumin 2.2 (L) 3.5 - 5.0 g/dL   AST 165 (H) 15 - 41 U/L   ALT 94 (H) 17 - 63 U/L   Alkaline Phosphatase 65 38 - 126 U/L   Total Bilirubin 5.4 (H) 0.3 - 1.2 mg/dL   GFR calc non Af Amer >60 >60 mL/min   GFR calc Af Amer >60 >60 mL/min    Comment: (NOTE) The eGFR has been calculated using the CKD EPI equation. This calculation has not been validated in all clinical situations. eGFR's persistently <60 mL/min signify possible Chronic Kidney Disease.    Anion gap 5 5 - 15  CBC     Status: Abnormal   Collection Time: 08/24/17  5:55 AM  Result Value Ref Range   WBC 8.9 4.0 - 10.5 K/uL   RBC 3.13 (L) 4.22 - 5.81 MIL/uL   Hemoglobin 10.6 (L) 13.0 - 17.0 g/dL   HCT 31.5 (L) 39.0 - 52.0 %   MCV 100.6 (H)  78.0 - 100.0 fL   MCH 33.9 26.0 - 34.0 pg   MCHC 33.7 30.0 - 36.0 g/dL   RDW 15.8 (H) 11.5 - 15.5 %   Platelets 53 (L) 150 - 400 K/uL    Comment: REPEATED TO VERIFY CONSISTENT WITH PREVIOUS RESULT   Magnesium     Status: None   Collection Time: 08/24/17  5:55 AM  Result Value Ref Range   Magnesium 1.7 1.7 - 2.4 mg/dL  Phosphorus     Status: None   Collection Time: 08/24/17  5:55 AM  Result Value Ref Range    Phosphorus 3.1 2.5 - 4.6 mg/dL  Comprehensive metabolic panel     Status: Abnormal   Collection Time: 08/24/17  5:55 AM  Result Value Ref Range   Sodium 130 (L) 135 - 145 mmol/L   Potassium 3.2 (L) 3.5 - 5.1 mmol/L   Chloride 95 (L) 101 - 111 mmol/L   CO2 31 22 - 32 mmol/L   Glucose, Bld 89 65 - 99 mg/dL   BUN 20 6 - 20 mg/dL   Creatinine, Ser 0.89 0.61 - 1.24 mg/dL   Calcium 7.9 (L) 8.9 - 10.3 mg/dL   Total Protein 4.6 (L) 6.5 - 8.1 g/dL   Albumin 2.3 (L) 3.5 - 5.0 g/dL   AST 138 (H) 15 - 41 U/L   ALT 90 (H) 17 - 63 U/L   Alkaline Phosphatase 57 38 - 126 U/L   Total Bilirubin 4.4 (H) 0.3 - 1.2 mg/dL   GFR calc non Af Amer >60 >60 mL/min   GFR calc Af Amer >60 >60 mL/min    Comment: (NOTE) The eGFR has been calculated using the CKD EPI equation. This calculation has not been validated in all clinical situations. eGFR's persistently <60 mL/min signify possible Chronic Kidney Disease.    Anion gap 4 (L) 5 - 15  Comprehensive metabolic panel     Status: Abnormal   Collection Time: 08/25/17  5:35 AM  Result Value Ref Range   Sodium 132 (L) 135 - 145 mmol/L   Potassium 3.5 3.5 - 5.1 mmol/L   Chloride 99 (L) 101 - 111 mmol/L   CO2 30 22 - 32 mmol/L   Glucose, Bld 97 65 - 99 mg/dL   BUN 14 6 - 20 mg/dL   Creatinine, Ser 0.75 0.61 - 1.24 mg/dL   Calcium 8.0 (L) 8.9 - 10.3 mg/dL   Total Protein 4.7 (L) 6.5 - 8.1 g/dL   Albumin 2.3 (L) 3.5 - 5.0 g/dL   AST 106 (H) 15 - 41 U/L   ALT 81 (H) 17 - 63 U/L   Alkaline Phosphatase 59 38 - 126 U/L   Total Bilirubin 3.7 (H) 0.3 - 1.2 mg/dL   GFR calc non Af Amer >60 >60 mL/min   GFR calc Af Amer >60 >60 mL/min    Comment: (NOTE) The eGFR has been calculated using the CKD EPI equation. This calculation has not been validated in all clinical situations. eGFR's persistently <60 mL/min signify possible Chronic Kidney Disease.    Anion gap 3 (L) 5 - 15  Parvovirus B19 antibody, IgG and IgM     Status: Abnormal   Collection Time: 08/25/17   5:35 AM  Result Value Ref Range   Parovirus B19 IgG Abs 6.8 (H) 0.0 - 0.8 index    Comment: (NOTE)  Negative        <0.9                                 Equivocal  0.9 - 1.1                                 Positive        >1.1    Parovirus B19 IgM Abs 0.1 0.0 - 0.8 index    Comment: (NOTE)                                 Negative        <0.9                                 Equivocal  0.9 - 1.1                                 Positive        >1.1 Performed At: [facility] [place], [place] [id number] Lindon Romp MD [id number]   Protime-INR     Status: None   Collection Time: 08/25/17  5:35 AM  Result Value Ref Range   Prothrombin Time 13.6 11.4 - 15.2 seconds   INR 1.05   CBC     Status: Abnormal   Collection Time: 08/25/17  5:35 AM  Result Value Ref Range   WBC 8.4 4.0 - 10.5 K/uL   RBC 3.07 (L) 4.22 - 5.81 MIL/uL   Hemoglobin 10.6 (L) 13.0 - 17.0 g/dL   HCT 31.3 (L) 39.0 - 52.0 %   MCV 102.0 (H) 78.0 - 100.0 fL   MCH 34.5 (H) 26.0 - 34.0 pg   MCHC 33.9 30.0 - 36.0 g/dL   RDW 16.1 (H) 11.5 - 15.5 %   Platelets 66 (L) 150 - 400 K/uL    Comment: CONSISTENT WITH PREVIOUS RESULT  Protime-INR     Status: None   Collection Time: 08/26/17  7:06 AM  Result Value Ref Range   Prothrombin Time 13.1 11.4 - 15.2 seconds   INR 1.00   CBC     Status: Abnormal   Collection Time: 08/26/17  7:06 AM  Result Value Ref Range   WBC 9.4 4.0 - 10.5 K/uL   RBC 3.05 (L) 4.22 - 5.81 MIL/uL   Hemoglobin 10.6 (L) 13.0 - 17.0 g/dL   HCT 31.0 (L) 39.0 - 52.0 %   MCV 101.6 (H) 78.0 - 100.0 fL   MCH 34.8 (H) 26.0 - 34.0 pg   MCHC 34.2 30.0 - 36.0 g/dL   RDW 15.8 (H) 11.5 - 15.5 %   Platelets 96 (L) 150 - 400 K/uL    Comment: CONSISTENT WITH PREVIOUS RESULT  Comprehensive metabolic panel     Status: Abnormal   Collection Time: 08/26/17  7:06 AM  Result Value Ref Range   Sodium 133 (L) 135 - 145 mmol/L   Potassium 3.8 3.5 - 5.1  mmol/L   Chloride 101 101 - 111 mmol/L   CO2 24 22 - 32 mmol/L   Glucose, Bld 88 65 - 99 mg/dL   BUN 12 6 - 20 mg/dL  Creatinine, Ser 0.63 0.61 - 1.24 mg/dL   Calcium 8.1 (L) 8.9 - 10.3 mg/dL   Total Protein 5.0 (L) 6.5 - 8.1 g/dL   Albumin 2.4 (L) 3.5 - 5.0 g/dL   AST 85 (H) 15 - 41 U/L   ALT 68 (H) 17 - 63 U/L   Alkaline Phosphatase 57 38 - 126 U/L   Total Bilirubin 3.4 (H) 0.3 - 1.2 mg/dL   GFR calc non Af Amer >60 >60 mL/min   GFR calc Af Amer >60 >60 mL/min    Comment: (NOTE) The eGFR has been calculated using the CKD EPI equation. This calculation has not been validated in all clinical situations. eGFR's persistently <60 mL/min signify possible Chronic Kidney Disease.    Anion gap 8 5 - 15  Differential     Status: Abnormal   Collection Time: 08/26/17  7:06 AM  Result Value Ref Range   Neutrophils Relative % 68 %   Neutro Abs 6.4 1.7 - 7.7 K/uL   Lymphocytes Relative 15 %   Lymphs Abs 1.4 0.7 - 4.0 K/uL   Monocytes Relative 14 %   Monocytes Absolute 1.4 (H) 0.1 - 1.0 K/uL   Eosinophils Relative 3 %   Eosinophils Absolute 0.2 0.0 - 0.7 K/uL   Basophils Relative 0 %   Basophils Absolute 0.0 0.0 - 0.1 K/uL  Comprehensive metabolic panel     Status: Abnormal   Collection Time: 08/26/17 11:17 AM  Result Value Ref Range   Sodium 133 (L) 135 - 145 mmol/L   Potassium 3.6 3.5 - 5.1 mmol/L   Chloride 100 (L) 101 - 111 mmol/L   CO2 28 22 - 32 mmol/L   Glucose, Bld 127 (H) 65 - 99 mg/dL   BUN 12 6 - 20 mg/dL   Creatinine, Ser 0.65 0.61 - 1.24 mg/dL   Calcium 8.1 (L) 8.9 - 10.3 mg/dL   Total Protein 5.2 (L) 6.5 - 8.1 g/dL   Albumin 2.5 (L) 3.5 - 5.0 g/dL   AST 85 (H) 15 - 41 U/L   ALT 72 (H) 17 - 63 U/L   Alkaline Phosphatase 64 38 - 126 U/L   Total Bilirubin 3.4 (H) 0.3 - 1.2 mg/dL   GFR calc non Af Amer >60 >60 mL/min   GFR calc Af Amer >60 >60 mL/min    Comment: (NOTE) The eGFR has been calculated using the CKD EPI equation. This calculation has not been validated  in all clinical situations. eGFR's persistently <60 mL/min signify possible Chronic Kidney Disease.    Anion gap 5 5 - 15  Comprehensive metabolic panel     Status: Abnormal   Collection Time: 08/28/17 12:52 PM  Result Value Ref Range   Sodium 133 (L) 135 - 145 mmol/L   Potassium 3.7 3.5 - 5.1 mmol/L   Chloride 102 101 - 111 mmol/L   CO2 24 22 - 32 mmol/L   Glucose, Bld 132 (H) 65 - 99 mg/dL   BUN 8 6 - 20 mg/dL   Creatinine, Ser 0.64 0.61 - 1.24 mg/dL   Calcium 8.3 (L) 8.9 - 10.3 mg/dL   Total Protein 6.0 (L) 6.5 - 8.1 g/dL   Albumin 2.7 (L) 3.5 - 5.0 g/dL   AST 56 (H) 15 - 41 U/L   ALT 54 17 - 63 U/L   Alkaline Phosphatase 59 38 - 126 U/L   Total Bilirubin 2.6 (H) 0.3 - 1.2 mg/dL   GFR calc non Af Amer >60 >60 mL/min  GFR calc Af Amer >60 >60 mL/min    Comment: (NOTE) The eGFR has been calculated using the CKD EPI equation. This calculation has not been validated in all clinical situations. eGFR's persistently <60 mL/min signify possible Chronic Kidney Disease.    Anion gap 7 5 - 15  CBC with Differential     Status: Abnormal   Collection Time: 08/28/17 12:52 PM  Result Value Ref Range   WBC 10.0 4.0 - 10.5 K/uL   RBC 3.25 (L) 4.22 - 5.81 MIL/uL   Hemoglobin 11.2 (L) 13.0 - 17.0 g/dL   HCT 33.1 (L) 39.0 - 52.0 %   MCV 101.8 (H) 78.0 - 100.0 fL   MCH 34.5 (H) 26.0 - 34.0 pg   MCHC 33.8 30.0 - 36.0 g/dL   RDW 14.6 11.5 - 15.5 %   Platelets 205 150 - 400 K/uL   Neutrophils Relative % 76 %   Neutro Abs 7.5 1.7 - 7.7 K/uL   Lymphocytes Relative 10 %   Lymphs Abs 1.0 0.7 - 4.0 K/uL   Monocytes Relative 13 %   Monocytes Absolute 1.3 (H) 0.1 - 1.0 K/uL   Eosinophils Relative 1 %   Eosinophils Absolute 0.1 0.0 - 0.7 K/uL   Basophils Relative 0 %   Basophils Absolute 0.0 0.0 - 0.1 K/uL  Protime-INR     Status: None   Collection Time: 08/28/17 12:52 PM  Result Value Ref Range   Prothrombin Time 12.4 11.4 - 15.2 seconds   INR 0.93   Culture, blood (Routine x 2)      Status: None   Collection Time: 08/28/17 12:52 PM  Result Value Ref Range   Specimen Description BLOOD RIGHT ANTECUBITAL    Special Requests      BOTTLES DRAWN AEROBIC AND ANAEROBIC Blood Culture adequate volume   Culture NO GROWTH 5 DAYS    Report Status 09/02/2017 FINAL   Culture, blood (Routine x 2)     Status: None   Collection Time: 08/28/17  1:04 PM  Result Value Ref Range   Specimen Description BLOOD RIGHT HAND    Special Requests IN PEDIATRIC BOTTLE Blood Culture adequate volume    Culture NO GROWTH 5 DAYS    Report Status 09/02/2017 FINAL   Urinalysis, Routine w reflex microscopic     Status: Abnormal   Collection Time: 08/28/17  1:54 PM  Result Value Ref Range   Color, Urine YELLOW YELLOW   APPearance CLEAR CLEAR   Specific Gravity, Urine 1.013 1.005 - 1.030   pH 7.0 5.0 - 8.0   Glucose, UA 150 (A) NEGATIVE mg/dL   Hgb urine dipstick NEGATIVE NEGATIVE   Bilirubin Urine NEGATIVE NEGATIVE   Ketones, ur NEGATIVE NEGATIVE mg/dL   Protein, ur NEGATIVE NEGATIVE mg/dL   Nitrite NEGATIVE NEGATIVE   Leukocytes, UA NEGATIVE NEGATIVE  Urine rapid drug screen (hosp performed)     Status: Abnormal   Collection Time: 08/28/17  1:54 PM  Result Value Ref Range   Opiates NONE DETECTED NONE DETECTED   Cocaine NONE DETECTED NONE DETECTED   Benzodiazepines POSITIVE (A) NONE DETECTED   Amphetamines NONE DETECTED NONE DETECTED   Tetrahydrocannabinol NONE DETECTED NONE DETECTED   Barbiturates NONE DETECTED NONE DETECTED    Comment:        DRUG SCREEN FOR MEDICAL PURPOSES ONLY.  IF CONFIRMATION IS NEEDED FOR ANY PURPOSE, NOTIFY LAB WITHIN 5 DAYS.        LOWEST DETECTABLE LIMITS FOR URINE DRUG SCREEN Drug Class  Cutoff (ng/mL) Amphetamine      1000 Barbiturate      200 Benzodiazepine   085 Tricyclics       694 Opiates          300 Cocaine          300 THC              50   Lipase, blood     Status: None   Collection Time: 08/28/17  2:02 PM  Result Value Ref Range    Lipase 24 11 - 51 U/L  Troponin I     Status: Abnormal   Collection Time: 08/28/17  2:02 PM  Result Value Ref Range   Troponin I 0.11 (HH) <0.03 ng/mL    Comment: CRITICAL VALUE NOTED.  VALUE IS CONSISTENT WITH PREVIOUSLY REPORTED AND CALLED VALUE.  Sedimentation rate     Status: Abnormal   Collection Time: 08/28/17  2:02 PM  Result Value Ref Range   Sed Rate 28 (H) 0 - 16 mm/hr  I-Stat CG4 Lactic Acid, ED     Status: None   Collection Time: 08/28/17  2:13 PM  Result Value Ref Range   Lactic Acid, Venous 1.17 0.5 - 1.9 mmol/L

## 2017-09-03 NOTE — Patient Instructions (Signed)

## 2017-09-05 ENCOUNTER — Other Ambulatory Visit: Payer: Self-pay

## 2017-09-05 ENCOUNTER — Other Ambulatory Visit: Payer: PRIVATE HEALTH INSURANCE

## 2017-09-05 DIAGNOSIS — Z0000 Encounter for general adult medical examination without abnormal findings: Secondary | ICD-10-CM

## 2017-09-06 ENCOUNTER — Other Ambulatory Visit: Payer: PRIVATE HEALTH INSURANCE

## 2017-09-06 DIAGNOSIS — Z0000 Encounter for general adult medical examination without abnormal findings: Secondary | ICD-10-CM

## 2017-09-07 LAB — CBC WITH DIFFERENTIAL/PLATELET
BASOS: 1 %
Basophils Absolute: 0.1 10*3/uL (ref 0.0–0.2)
EOS (ABSOLUTE): 0.3 10*3/uL (ref 0.0–0.4)
Eos: 7 %
HEMATOCRIT: 38.5 % (ref 37.5–51.0)
Hemoglobin: 12.7 g/dL — ABNORMAL LOW (ref 13.0–17.7)
IMMATURE GRANS (ABS): 0 10*3/uL (ref 0.0–0.1)
Immature Granulocytes: 0 %
LYMPHS ABS: 2.2 10*3/uL (ref 0.7–3.1)
LYMPHS: 46 %
MCH: 33.5 pg — AB (ref 26.6–33.0)
MCHC: 33 g/dL (ref 31.5–35.7)
MCV: 102 fL — AB (ref 79–97)
MONOCYTES: 11 %
Monocytes Absolute: 0.5 10*3/uL (ref 0.1–0.9)
NEUTROS ABS: 1.6 10*3/uL (ref 1.4–7.0)
Neutrophils: 35 %
PLATELETS: 746 10*3/uL — AB (ref 150–379)
RBC: 3.79 x10E6/uL — ABNORMAL LOW (ref 4.14–5.80)
RDW: 14 % (ref 12.3–15.4)
WBC: 4.6 10*3/uL (ref 3.4–10.8)

## 2017-09-07 LAB — COMPREHENSIVE METABOLIC PANEL
A/G RATIO: 1.2 (ref 1.2–2.2)
ALBUMIN: 3.8 g/dL (ref 3.5–5.5)
ALT: 54 IU/L — AB (ref 0–44)
AST: 57 IU/L — ABNORMAL HIGH (ref 0–40)
Alkaline Phosphatase: 90 IU/L (ref 39–117)
BUN / CREAT RATIO: 18 (ref 9–20)
BUN: 10 mg/dL (ref 6–20)
Bilirubin Total: 1.2 mg/dL (ref 0.0–1.2)
CALCIUM: 9.9 mg/dL (ref 8.7–10.2)
CO2: 24 mmol/L (ref 20–29)
Chloride: 97 mmol/L (ref 96–106)
Creatinine, Ser: 0.56 mg/dL — ABNORMAL LOW (ref 0.76–1.27)
GFR, EST AFRICAN AMERICAN: 159 mL/min/{1.73_m2} (ref >59)
GFR, EST NON AFRICAN AMERICAN: 138 mL/min/{1.73_m2} (ref >59)
GLUCOSE: 85 mg/dL (ref 65–99)
Globulin, Total: 3.1 g/dL (ref 1.5–4.5)
Potassium: 5 mmol/L (ref 3.5–5.2)
Sodium: 138 mmol/L (ref 134–144)
TOTAL PROTEIN: 6.9 g/dL (ref 6.0–8.5)

## 2017-09-07 LAB — HEMOGLOBIN A1C
Est. average glucose Bld gHb Est-mCnc: 88 mg/dL
Hgb A1c MFr Bld: 4.7 % — ABNORMAL LOW (ref 4.8–5.6)

## 2017-09-07 LAB — LIPID PANEL
CHOL/HDL RATIO: 4 ratio (ref 0.0–5.0)
CHOLESTEROL TOTAL: 179 mg/dL (ref 100–199)
HDL: 45 mg/dL (ref >39)
LDL CALC: 118 mg/dL — AB (ref 0–99)
TRIGLYCERIDES: 81 mg/dL (ref 0–149)
VLDL CHOLESTEROL CAL: 16 mg/dL (ref 5–40)

## 2017-09-07 LAB — TSH: TSH: 4.23 u[IU]/mL (ref 0.450–4.500)

## 2017-09-07 LAB — VITAMIN D 25 HYDROXY (VIT D DEFICIENCY, FRACTURES): Vit D, 25-Hydroxy: 22.2 ng/mL — ABNORMAL LOW (ref 30.0–100.0)

## 2017-09-11 ENCOUNTER — Encounter: Payer: Self-pay | Admitting: Family Medicine

## 2017-09-11 ENCOUNTER — Telehealth: Payer: Self-pay

## 2017-09-11 ENCOUNTER — Telehealth: Payer: Self-pay | Admitting: Family Medicine

## 2017-09-11 NOTE — Telephone Encounter (Signed)
LVM for pt to return call to discuss where his eczema occurs.  Tiajuana Amass, CMA

## 2017-09-11 NOTE — Telephone Encounter (Signed)
Patient states has called 3 times regarding a call in Rx for his eczema ointment to [facility] on [address], [place]. --glh

## 2017-09-12 ENCOUNTER — Other Ambulatory Visit: Payer: Self-pay

## 2017-09-12 MED ORDER — TRIAMCINOLONE ACETONIDE 0.1 % EX CREA: 1.0000 "application " | TOPICAL_CREAM | Freq: Two times a day (BID) | CUTANEOUS | 0 refills | Status: AC

## 2017-09-12 NOTE — Telephone Encounter (Signed)
RX sent to pharmacy.  T. Darnesha Diloreto, CMA 

## 2017-09-26 ENCOUNTER — Encounter: Payer: Self-pay | Admitting: Family Medicine

## 2017-11-07 ENCOUNTER — Ambulatory Visit (INDEPENDENT_AMBULATORY_CARE_PROVIDER_SITE_OTHER): Payer: PRIVATE HEALTH INSURANCE | Admitting: Family Medicine

## 2017-11-07 ENCOUNTER — Encounter: Payer: Self-pay | Admitting: Family Medicine

## 2017-11-07 ENCOUNTER — Ambulatory Visit
Admission: RE | Admit: 2017-11-07 | Discharge: 2017-11-07 | Disposition: A | Discharge status: Home / Self Care | Payer: PRIVATE HEALTH INSURANCE | Source: Ambulatory Visit | Attending: Family Medicine | Admitting: Family Medicine

## 2017-11-07 VITALS — BP 160/120 | HR 92 | Ht 70.0 in | Wt 150.0 lb

## 2017-11-07 DIAGNOSIS — N50811 Right testicular pain: Secondary | ICD-10-CM

## 2017-11-07 DIAGNOSIS — N451 Epididymitis: Secondary | ICD-10-CM

## 2017-11-07 MED ORDER — DOXYCYCLINE HYCLATE 100 MG PO TABS: 100.0000 mg | ORAL_TABLET | Freq: Two times a day (BID) | ORAL | 0 refills | Status: AC

## 2017-11-07 NOTE — Progress Notes (Signed)
Pt here for an acute care OV today   Impression and Recommendations:    1. Testicular pain, right   2. Epididymitis     1. Testicular pain, right -Long discussion with patient regarding STD etiology for cases such as this.  Patient declines testing at this time.   Education and routine counseling performed. Handouts provided  Gross side effects, risk and benefits, and alternatives of medications and treatment plan in general discussed with patient.  Patient is aware that all medications have potential side effects and we are unable to predict every side effect or drug-drug interaction that may occur.   Patient will call with any questions prior to using medication if they have concerns.  Expresses verbal understanding and consents to current therapy and treatment regimen.  No barriers to understanding were identified.  Red flag symptoms and signs discussed in detail.  Patient expressed understanding regarding what to do in case of emergency\urgent symptoms  Please see AVS handed out to patient at the end of our visit for further patient instructions/ counseling done pertaining to today's office visit.   No Follow-up on file.     Note: This document was prepared using Dragon voice recognition software and may include unintentional dictation errors.  Monchel Pollitt 10:59 AM --------------------------------------------------------------------------------------------------------------------------------------------------------------------------------------------------------------------------------------------    Subjective:    CC:  Chief Complaint  Patient presents with   Testicle Pain    right side - pain and enlarged x 24 hours     HPI: Dara HoyerCody Coopman is a 31 y.o. male who [facility] today for issues as discussed below.  Patient first noticed right testicular pain when he was shaving approximately 2 days ago.  Gradually it has  slowly gotten a little worse.  Wife is a Engineer, civil (consulting)nurse and works with young kids in the ER and was worried about testicular torsion.  Patient denies any fever chills, no nausea vomiting diarrhea, mild radiation of pain into right lower abdomen from right scrotal sac.  No urethral discharge, no urinary frequency urgency or dysuria.  No hermit hematuria. No problems updated.   Wt Readings from Last 3 Encounters:  11/07/17 150 lb (68 kg)  09/03/17 149 lb 6.4 oz (67.8 kg)  08/28/17 165 lb (74.8 kg)   BP Readings from Last 3 Encounters:  11/07/17 (!) 160/120  09/03/17 117/79  08/28/17 (!) 143/100   BMI Readings from Last 3 Encounters:  11/07/17 21.52 kg/m  09/03/17 21.44 kg/m  08/28/17 24.37 kg/m     Patient Care Team    Relationship Specialty Notifications Start End  Thomasene Lotpalski, Massey Ruhland, DO PCP - General Family Medicine  09/03/17   Willis Modenautlaw, William, MD Consulting Physician Gastroenterology  09/03/17      Patient Active Problem List   Diagnosis Date Noted   Metabolic acidosis    Acute hepatic failure 08/19/2017    Past Medical history, Surgical history, Family history, Social history, Allergies and Medications have been entered into the medical record, reviewed and changed as needed.    Current Meds  Medication Sig   Buprenorphine HCl-Naloxone HCl (SUBOXONE) 8-2 MG FILM Place 1 Film under the tongue 2 (two) times daily.   Multiple Vitamin (MULTIVITAMIN WITH MINERALS) TABS tablet Take 1 tablet by mouth daily.   triamcinolone cream (KENALOG) 0.1 % Apply 1 application topically 2 (two) times daily. To arms and hands    Allergies:  No Known Allergies   Review of Systems: General:   Denies fever, chills, unexplained weight  loss.  Optho/Auditory:   Denies visual changes, blurred vision/LOV Respiratory:   Denies wheeze, DOE more than baseline levels.  Cardiovascular:   Denies chest pain, palpitations, new onset peripheral edema  Gastrointestinal:   Denies nausea, vomiting,  diarrhea, abd pain.  Genitourinary: Denies dysuria, freq/ urgency, flank pain or discharge from genitals.  Endocrine:     Denies hot or cold intolerance, polyuria, polydipsia. Musculoskeletal:   Denies unexplained myalgias, joint swelling, unexplained arthralgias, gait problems.  Skin:  Denies new onset rash, suspicious lesions Neurological:     Denies dizziness, unexplained weakness, numbness  Psychiatric/Behavioral:   Denies mood changes, suicidal or homicidal ideations, hallucinations    Objective:   Blood pressure (!) 160/120, pulse 92, height 5\' 10"  (1.778 m), weight 150 lb (68 kg). Body mass index is 21.52 kg/m. General:  Well Developed, well nourished, appropriate for stated age.  Neuro:  Alert and oriented,  extra-ocular muscles intact  HEENT:  Normocephalic, atraumatic, neck supple Skin:  no gross rash, warm, pink. Cardiac:  RRR, S1 S2 Respiratory:  ECTA B/L and A/P, Not using accessory muscles, speaking in full sentences- unlabored. Teste: Patient with intact cremaster reflex bilaterally + Prehn's sign. -R , scrotal swelling and pain upon palpation of the epididymis and superior and posterior lateral to the testicle. Vascular:  Ext warm, no cyanosis apprec.; cap RF less 2 sec. Psych:  No HI/SI, judgement and insight good, Euthymic mood. Full Affect.

## 2017-11-07 NOTE — Patient Instructions (Signed)
Please go for ultrasound today.  [facility]  -Please take all antibiotics even if you are feeling better within a couple of days.  -  Epididymitis Epididymitis is swelling (inflammation) of the epididymis. The epididymis is a cord-like structure that is located along the top and back part of the testicle. It collects and stores sperm from the testicle. This condition can also cause pain and swelling of the testicle and scrotum. Symptoms usually start suddenly (acute epididymitis). Sometimes epididymitis starts gradually and lasts for a while (chronic epididymitis). This type may be harder to treat. What are the causes? In men 5235 and younger, this condition is usually caused by a bacterial infection or sexually transmitted disease (STD), such as:  Gonorrhea.  Chlamydia.  In men 3935 and older who do not have anal sex, this condition is usually caused by bacteria from a blockage or abnormalities in the urinary system. These can result from:  Having a tube placed into the bladder (urinary catheter).  Having an enlarged or inflamed prostate gland.  Having recent urinary tract surgery.  In men who have a condition that weakens the body's defense system (immune system), such as HIV, this condition can be caused by:  Other bacteria, including tuberculosis and syphilis.  Viruses.  Fungi.  Sometimes this condition occurs without infection. That may happen if urine flows backward into the epididymis after heavy lifting or straining. What increases the risk? This condition is more likely to develop in men:  Who have unprotected sex with more than one partner.  Who have anal sex.  Who have recently had surgery.  Who have a urinary catheter.  Who have urinary problems.  Who have a suppressed immune system.  What are the signs or symptoms? This condition usually begins suddenly with chills, fever, and pain behind the scrotum and in the testicle. Other  symptoms include:  Swelling of the scrotum, testicle, or both.  Pain whenejaculatingor urinating.  Pain in the back or belly.  Nausea.  Itching and discharge from the penis.  Frequent need to pass urine.  Redness and tenderness of the scrotum.  How is this diagnosed? Your health care provider can diagnose this condition based on your symptoms and medical history. Your health care provider will also do a physical exam to ask about your symptoms and check your scrotum and testicle for swelling, pain, and redness. You may also have other tests, including:  Examination of discharge from the penis.  Urine tests for infections, such as STDs.  Your health care provider may test you for other STDs, including HIV. How is this treated? Treatment for this condition depends on the cause. If your condition is caused by a bacterial infection, oral antibiotic medicine may be prescribed. If the bacterial infection has spread to your blood, you may need to receive IV antibiotics. Nonbacterial epididymitis is treated with home care that includes bed rest and elevation of the scrotum. Surgery may be needed to treat:  Bacterial epididymitis that causes pus to build up in the scrotum (abscess).  Chronic epididymitis that has not responded to other treatments.  Follow these instructions at home: Medicines  Take over-the-counter and prescription medicines only as told by your health care provider.  If you were prescribed an antibiotic medicine, take it as told by your health care provider. Do not stop taking the antibiotic even if your condition improves. Sexual Activity  If your epididymitis was caused by an STD, avoid sexual activity until your treatment  is complete.  Inform your sexual partner or partners if you test positive for an STD. They may need to be treated.Do not engage in sexual activity with your partner or partners until their treatment is completed. General  instructions  Return to your normal activities as told by your health care provider. Ask your health care provider what activities are safe for you.  Keep your scrotum elevated and supported while resting. Ask your health care provider if you should wear a scrotal support, such as a jockstrap. Wear it as told by your health care provider.  If directed, apply ice to the affected area: ? Put ice in a plastic bag. ? Place a towel between your skin and the bag. ? Leave the ice on for 20 minutes, 2-3 times per day.  Try taking a sitz bath to help with discomfort. This is a warm water bath that is taken while you are sitting down. The water should only come up to your hips and should cover your buttocks. Do this 3-4 times per day or as told by your health care provider.  Keep all follow-up visits as told by your health care provider. This is important. Contact a health care provider if:  You have a fever.  Your pain medicine is not helping.  Your pain is getting worse.  Your symptoms do not improve within three days. This information is not intended to replace advice given to you by your health care provider. Make sure you discuss any questions you have with your health care provider. Document Released: 11/24/2000 Document Revised: 05/04/2016 Document Reviewed: 04/14/2015 Elsevier Interactive Patient Education  2018 [employer]

## 2017-11-08 ENCOUNTER — Telehealth: Payer: Self-pay | Admitting: Family Medicine

## 2017-11-08 ENCOUNTER — Other Ambulatory Visit: Payer: Self-pay

## 2017-11-08 DIAGNOSIS — K4090 Unilateral inguinal hernia, without obstruction or gangrene, not specified as recurrent: Secondary | ICD-10-CM

## 2017-11-08 NOTE — Telephone Encounter (Signed)
Pt called to question whether to continue medication/ antibiotics gvn by provider yesterday--Pls call -glh

## 2017-11-08 NOTE — Telephone Encounter (Signed)
Per Dr. Sharee Holsterpalski continue antibiotics, patient notified. MPulliam, CMA/RT(R)

## 2017-11-08 NOTE — Telephone Encounter (Signed)
Patient is concerned about the ultrasound we sent him for yesterday, he is still having moderate pain and was hoping for a call back today on the results

## 2017-11-08 NOTE — Telephone Encounter (Signed)
Patient notified of results and per Dr. Sharee Holsterpalski referral placed for general surgery. MPulliam, CMA/RT(R)

## 2017-11-08 NOTE — Progress Notes (Signed)
US Scrotum with Doppler report reviewed by Dr. Sharee Holsterpalski. Report noted probable small right inguinal hernia.  Per he instructions referral placed for general surgery.  Patient was notified. MPulliam, CMA/RT(R)

## 2017-11-19 ENCOUNTER — Ambulatory Visit: Payer: PRIVATE HEALTH INSURANCE | Admitting: Family Medicine

## 2018-03-18 ENCOUNTER — Other Ambulatory Visit: Payer: Self-pay | Admitting: Physician Assistant

## 2018-03-18 DIAGNOSIS — R945 Abnormal results of liver function studies: Secondary | ICD-10-CM

## 2018-03-18 DIAGNOSIS — R7989 Other specified abnormal findings of blood chemistry: Secondary | ICD-10-CM

## 2018-03-18 DIAGNOSIS — R188 Other ascites: Secondary | ICD-10-CM

## 2018-03-18 LAB — BASIC METABOLIC PANEL
BUN: 4 (ref 4–21)
Creatinine: 0.5 — AB (ref 0.6–1.3)
GLUCOSE: 95
Potassium: 4.1 (ref 3.4–5.3)
Sodium: 135 — AB (ref 137–147)

## 2018-03-18 LAB — HEPATIC FUNCTION PANEL
ALT: 23 (ref 10–40)
AST: 123 — AB (ref 14–40)
Alkaline Phosphatase: 218 — AB (ref 25–125)
BILIRUBIN, TOTAL: 7.4
Bilirubin, Direct: 4.2 — AB (ref 0.01–0.4)

## 2018-03-18 LAB — CBC AND DIFFERENTIAL
HCT: 40 — AB (ref 41–53)
HEMOGLOBIN: 13.3 — AB (ref 13.5–17.5)
Platelets: 215 (ref 150–399)
WBC: 13.7

## 2018-03-20 ENCOUNTER — Other Ambulatory Visit: Payer: PRIVATE HEALTH INSURANCE

## 2018-04-01 ENCOUNTER — Ambulatory Visit
Admission: RE | Admit: 2018-04-01 | Discharge: 2018-04-01 | Disposition: A | Discharge status: Home / Self Care | Payer: PRIVATE HEALTH INSURANCE | Source: Ambulatory Visit | Attending: Physician Assistant | Admitting: Physician Assistant

## 2018-04-01 DIAGNOSIS — R7989 Other specified abnormal findings of blood chemistry: Secondary | ICD-10-CM

## 2018-04-01 DIAGNOSIS — R188 Other ascites: Secondary | ICD-10-CM

## 2018-04-01 DIAGNOSIS — R945 Abnormal results of liver function studies: Secondary | ICD-10-CM

## 2018-04-26 ENCOUNTER — Encounter: Payer: Self-pay | Admitting: Family Medicine

## 2018-05-01 ENCOUNTER — Encounter: Payer: Self-pay | Admitting: Family Medicine

## 2019-04-10 IMAGING — US US SCROTUM W/ DOPPLER COMPLETE
1 series · 13 of 25 positions shown · non-contrast
Comparison: None.

CLINICAL DATA: 31 y/o M; 2 days of right testicular pain and
swelling.

EXAM:
SCROTAL ULTRASOUND
DOPPLER ULTRASOUND OF THE TESTICLES
TECHNIQUE: Complete ultrasound examination of the testicles, epididymis, and
other scrotal structures was performed. Color and spectral Doppler
ultrasound were also utilized to evaluate blood flow to the
testicles.

[Series 1: us scrotum w/ doppler complete · 0.06mm/px · 13 of 70 slices shown]
[im 1/70]
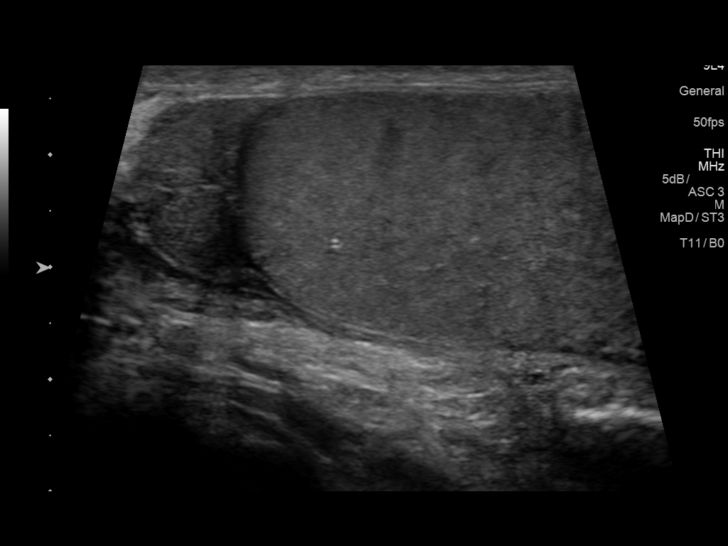
[im 6/70]
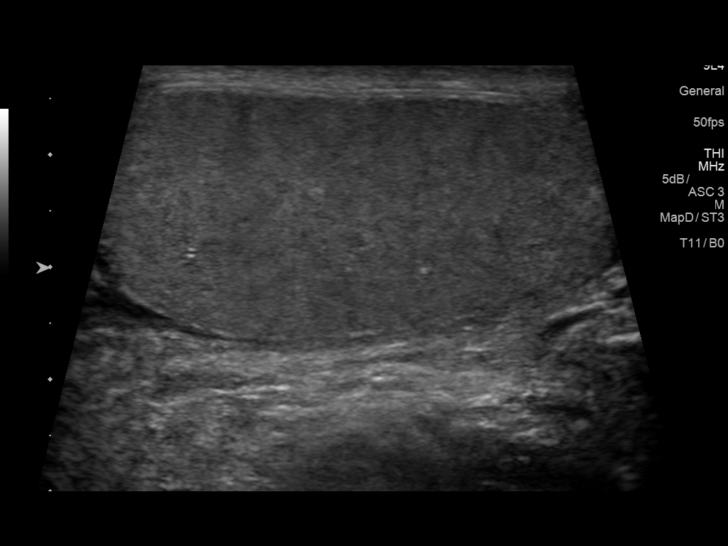
[im 12/70]
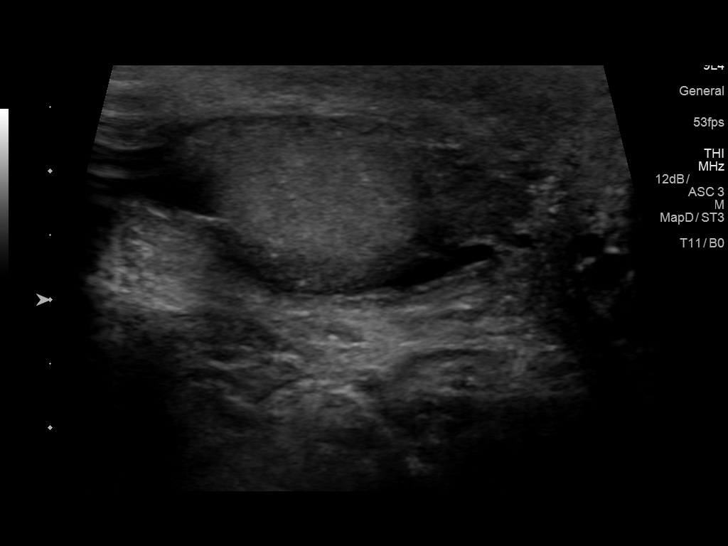
[im 18/70]
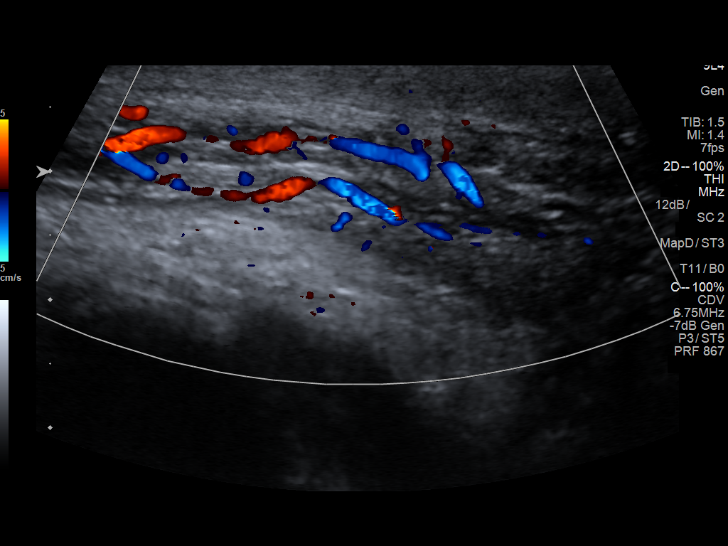
[im 24/70]
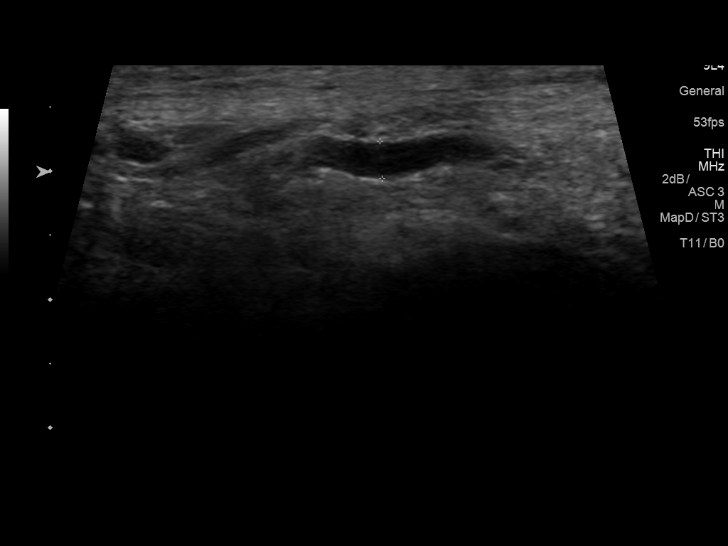
[im 29/70]
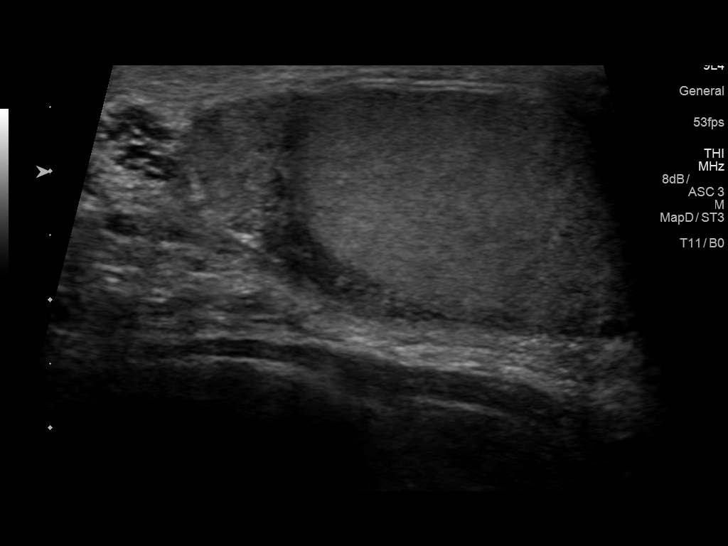
[im 35/70]
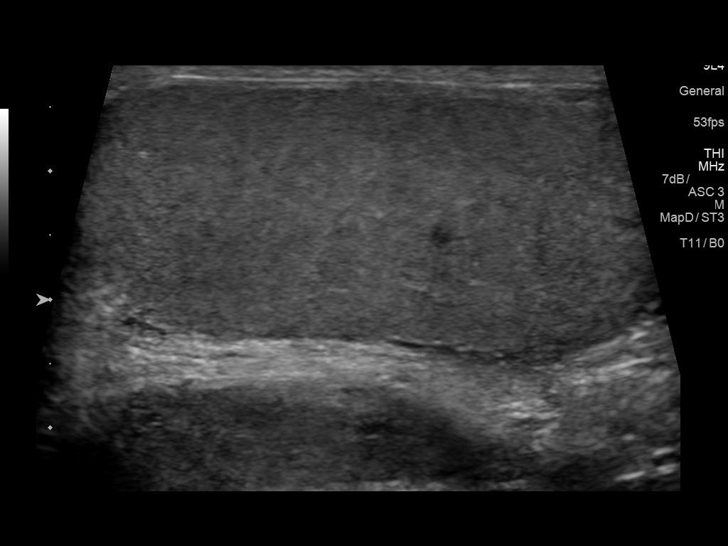
[im 41/70]
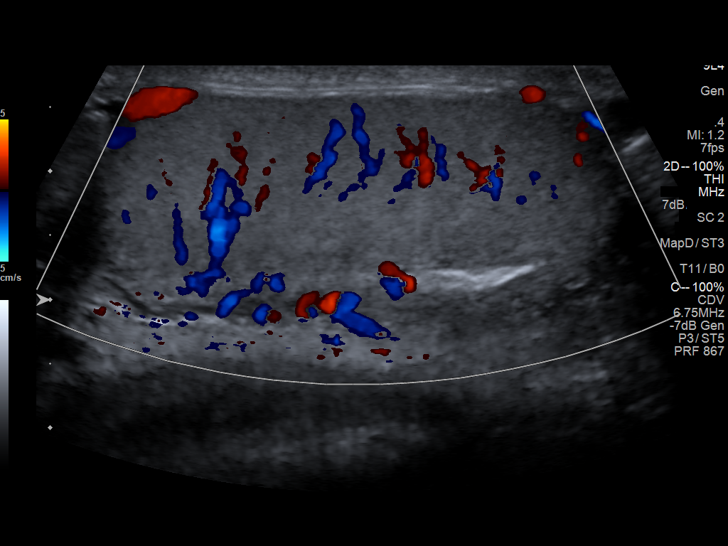
[im 47/70]
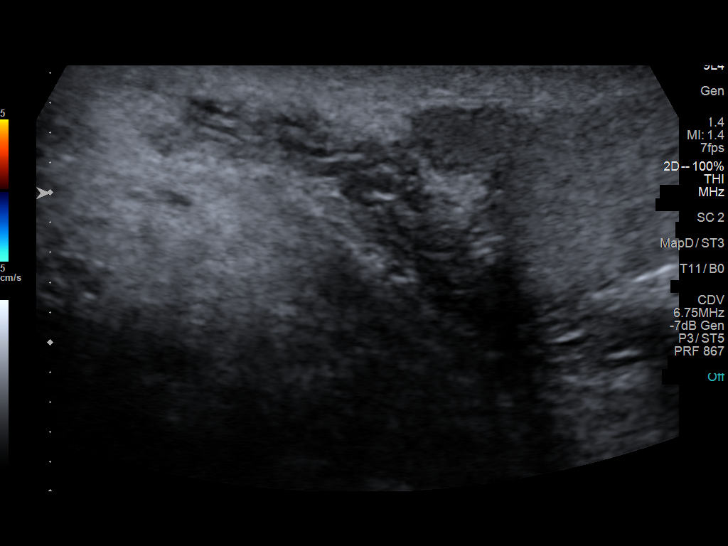
[im 52/70]
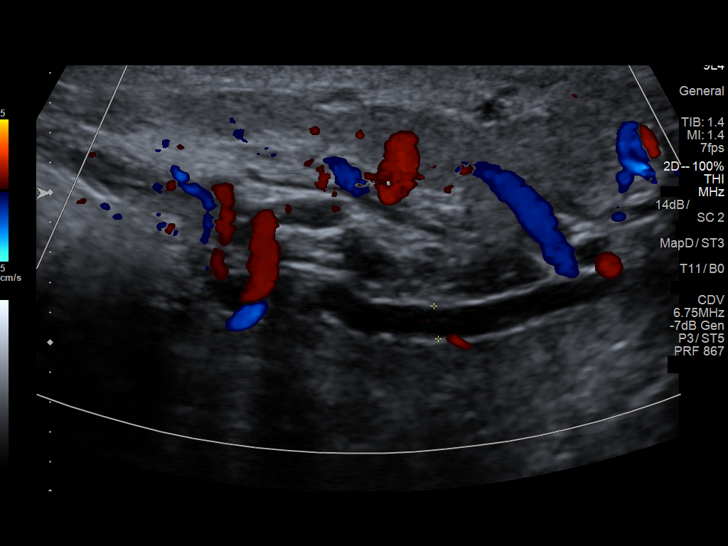
[im 58/70]
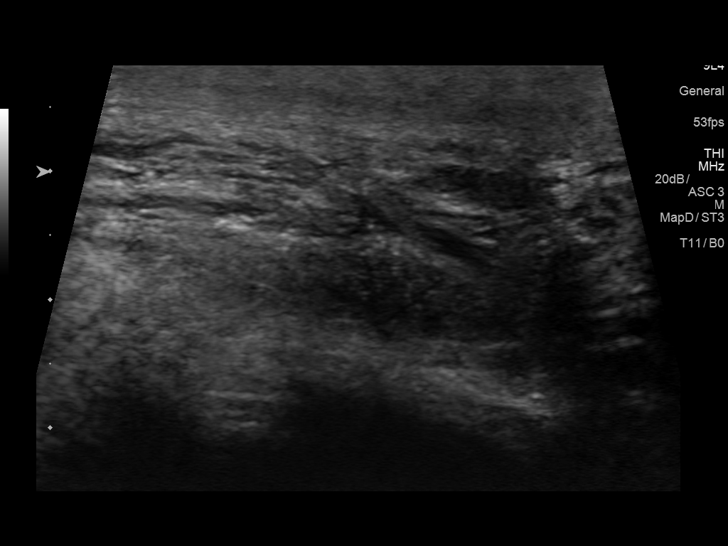
[im 64/70]
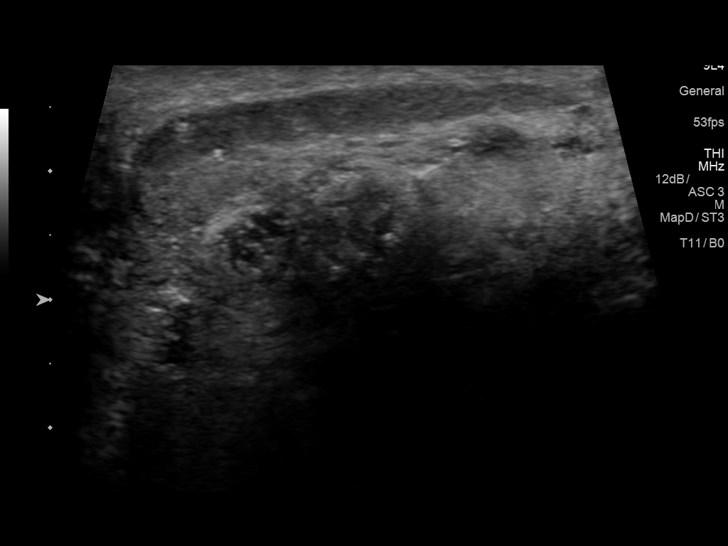
[im 70/70]
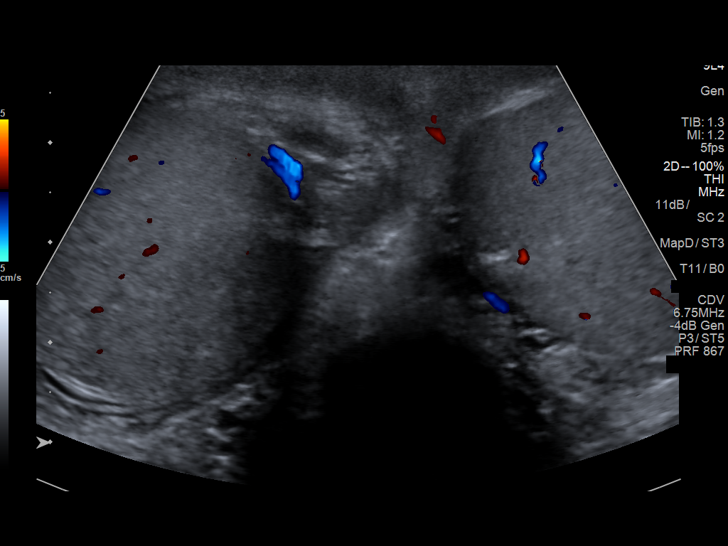

[13 of 25 positions shown; findings below may reference images not displayed]

FINDINGS: Right testicle

Measurements: 4.7 x 2.2 x 3.2 cm. No mass or microlithiasis
visualized.

Left testicle

Measurements: 4.7 x 2.1 x 3.4 cm. No mass or microlithiasis
visualized.

Right epididymis: Normal in size. Small echogenic foci are present
within the head of epididymis, probably punctate calcifications.

Left epididymis: Normal in size. Small echogenic foci are present
within the head of epididymis, probably punctate calcifications.

Hydrocele:  None visualized.

Varicocele:  None visualized.

Pulsed Doppler interrogation of both testes demonstrates normal low
resistance arterial and venous waveforms bilaterally.

Probable small right inguinal hernia containing fat.
IMPRESSION: 1. Probable small right inguinal hernia containing fat.
2. No findings of acute epididymo-orchitis or torsion at this time.
3. Echogenic foci within the epididymal heads, probably
calcifications from prior infection, trauma, or disorder of calcium
metabolism.

By: Carole Cane M.D.

## 2019-07-19 IMAGING — DX DG CHEST 1V PORT
1 series · 1 of 1 positions shown · non-contrast
Comparison: 08/19/2017

CLINICAL DATA: Encounter for hemodialysis

EXAM:
PORTABLE CHEST 1 VIEW

[chest ap]
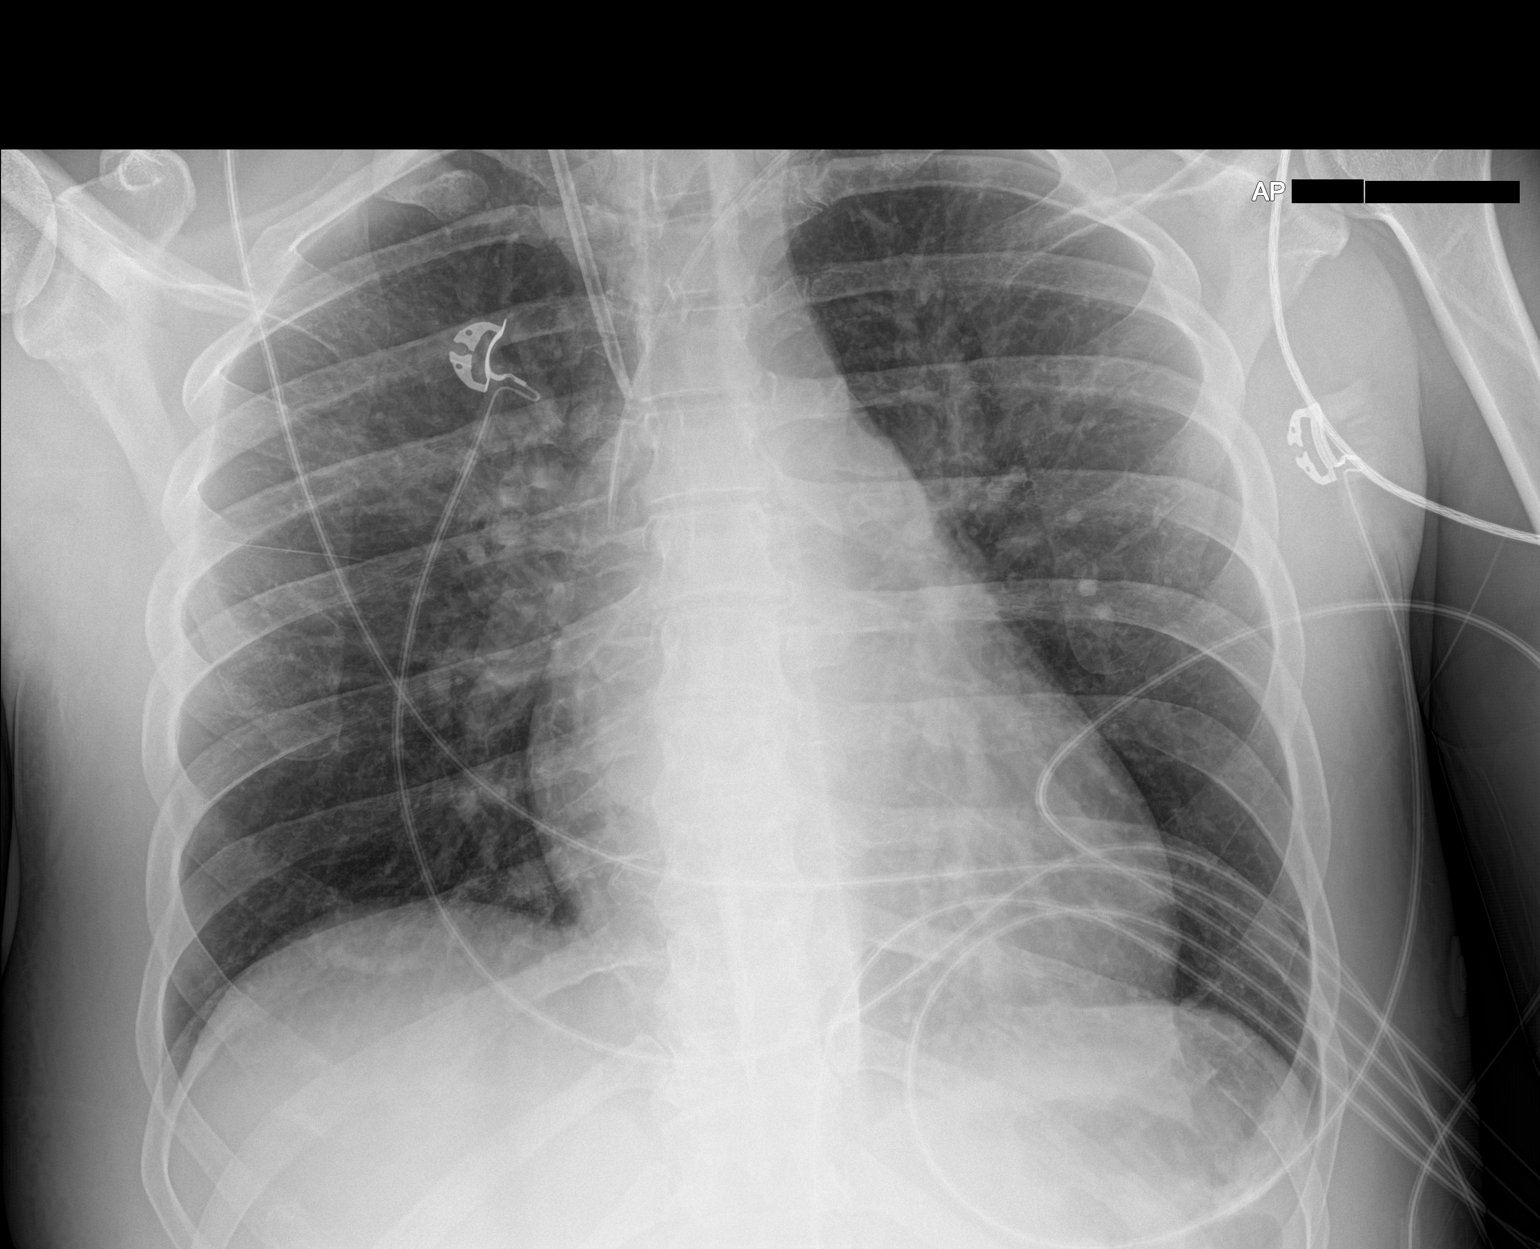

[1 of 1 positions shown; findings below may reference images not displayed]

FINDINGS: Left subclavian central line remains in place with the tip in the
SVC. Interval placement of right internal jugular Vas-Cath with the
tip in the SVC. No pneumothorax. Heart is upper limits normal in
size. Lungs are clear. No effusions. No acute bony abnormality.
IMPRESSION: Right dialysis catheter tip in the SVC.  No pneumothorax.

## 2019-07-20 IMAGING — DX DG CHEST 1V PORT
2 series · 2 of 2 positions shown · non-contrast
Comparison: 08/20/2017.

CLINICAL DATA: Respiratory failure.

EXAM:
PORTABLE CHEST 1 VIEW

[chest ap (1 of 2)]
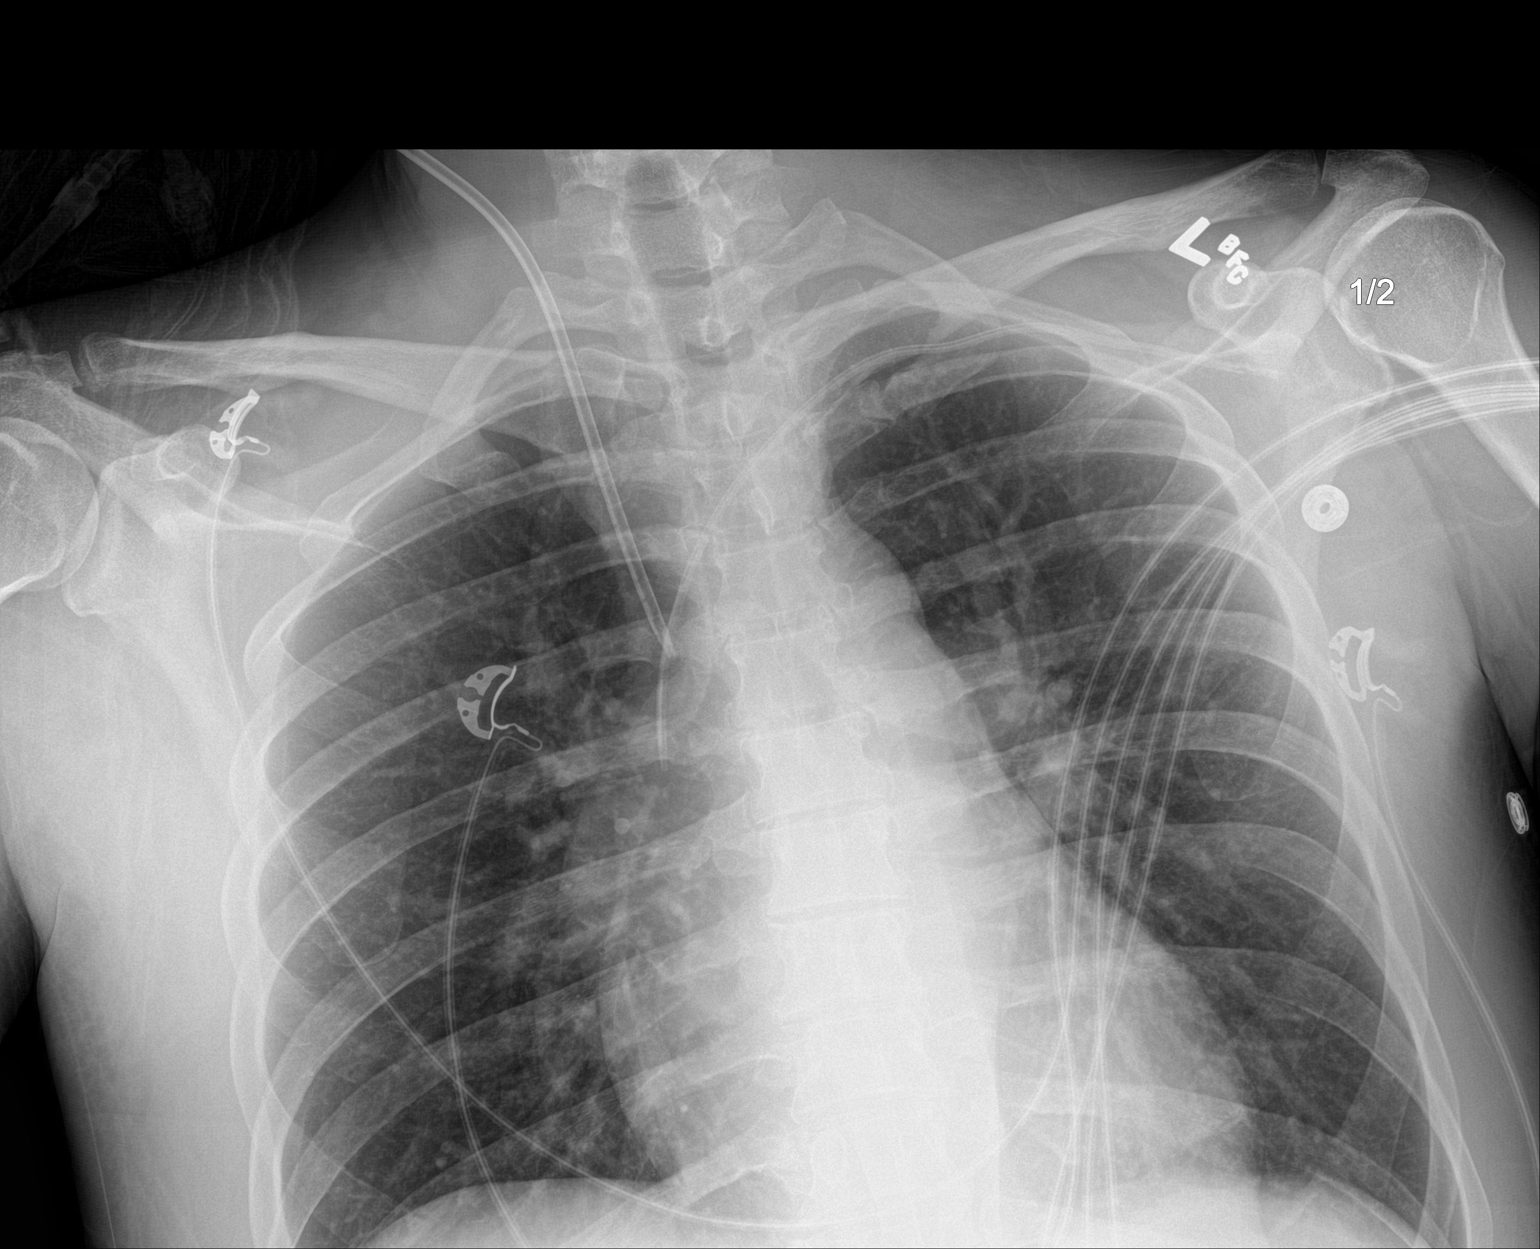

[chest ap (2 of 2)]
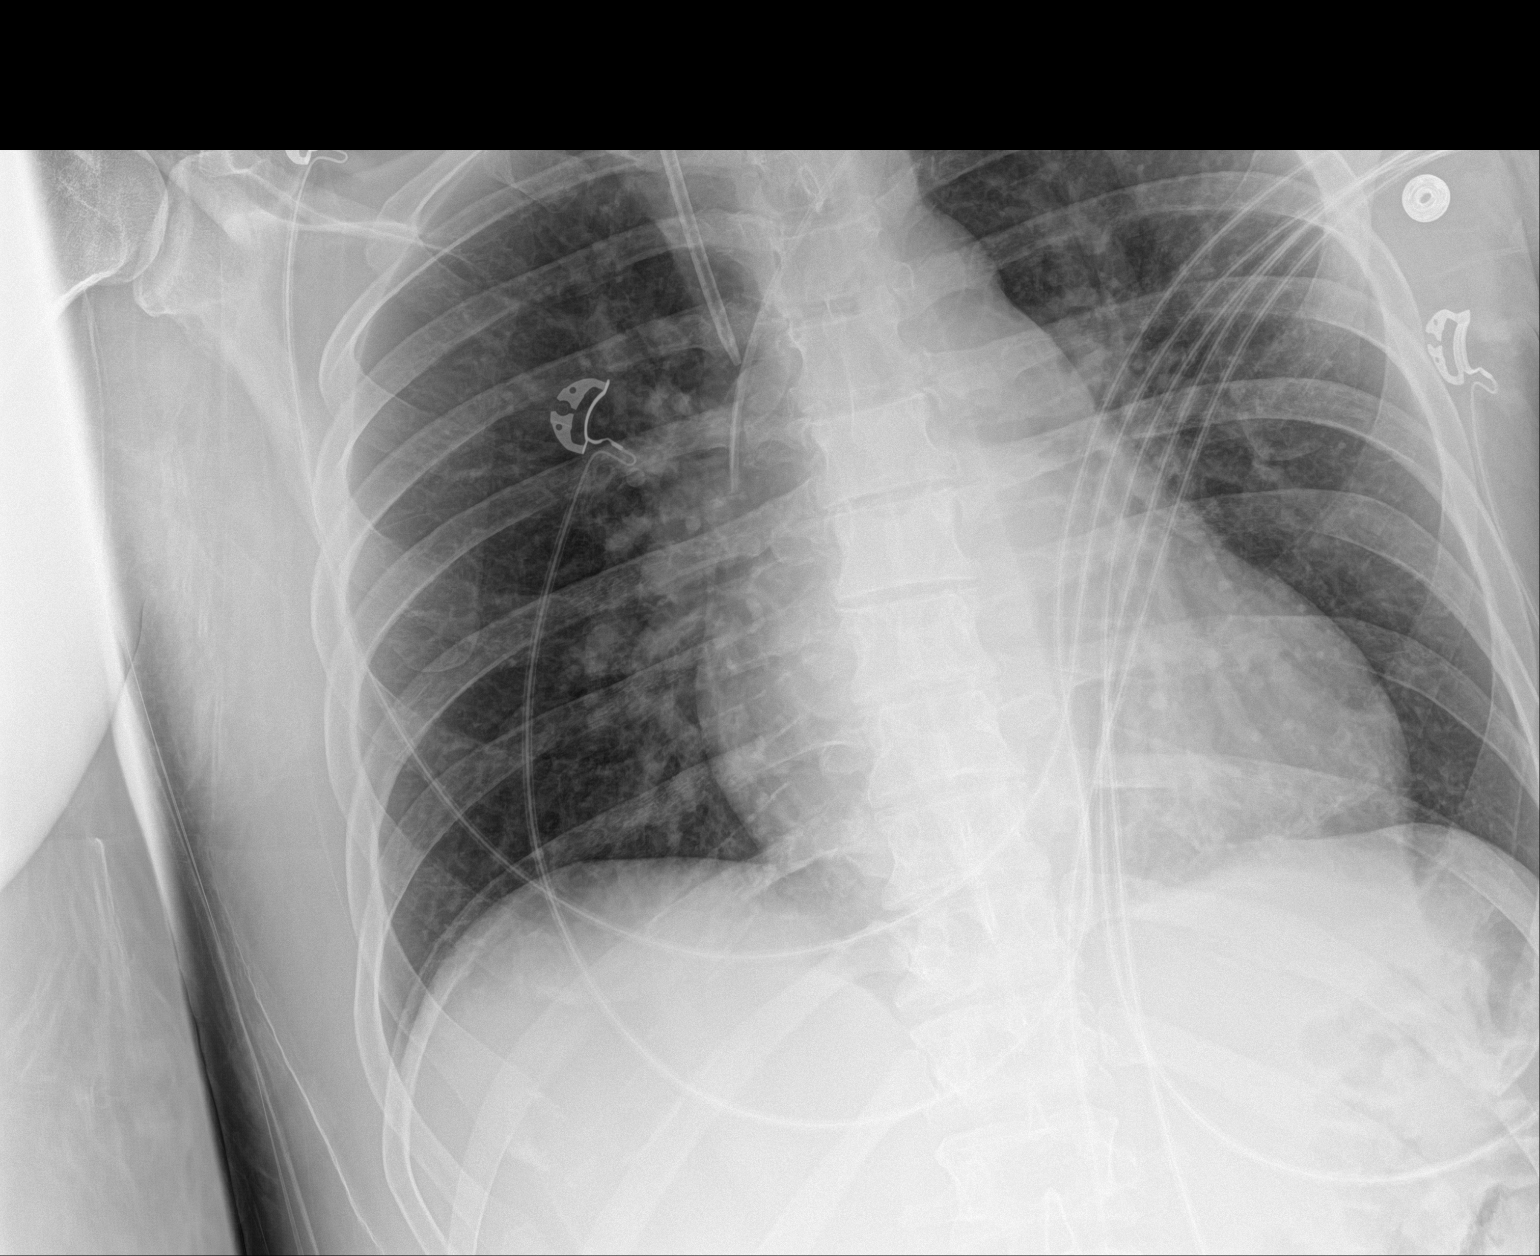

[2 of 2 positions shown; findings below may reference images not displayed]

FINDINGS: Right IJ line, left subclavian line in stable position. Mediastinum
hilar structures normal. Cardiomegaly with normal pulmonary
vascularity. No focal infiltrate. No pleural effusion or
pneumothorax
IMPRESSION: 1. Lines tubes in stable position.

2. Stable cardiomegaly.  No pulmonary venous congestion .

## 2019-09-02 IMAGING — US US ABDOMEN COMPLETE
1 series · 13 of 25 positions shown · non-contrast
Comparison: None.

CLINICAL DATA: 31-year-old male with elevated LFTs. Initial
encounter.

EXAM:
ABDOMEN ULTRASOUND COMPLETE

[Series 1: us abdomen complete · 0.25mm/px · 13 of 94 slices shown]
[im 1/94]
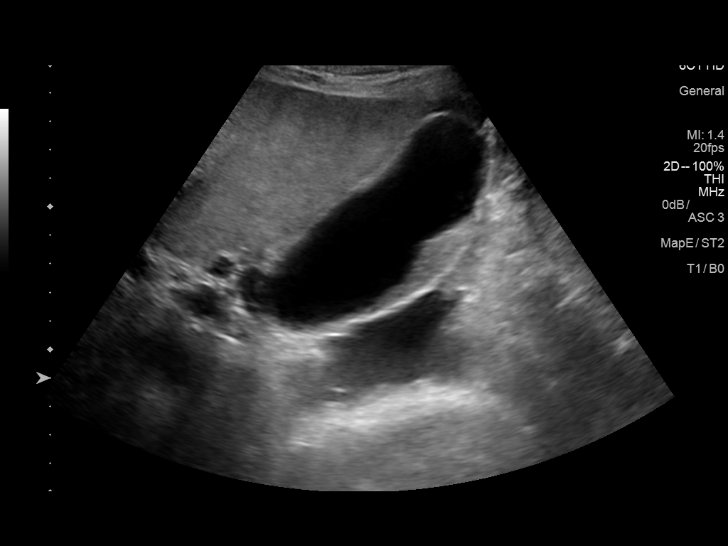
[im 8/94]
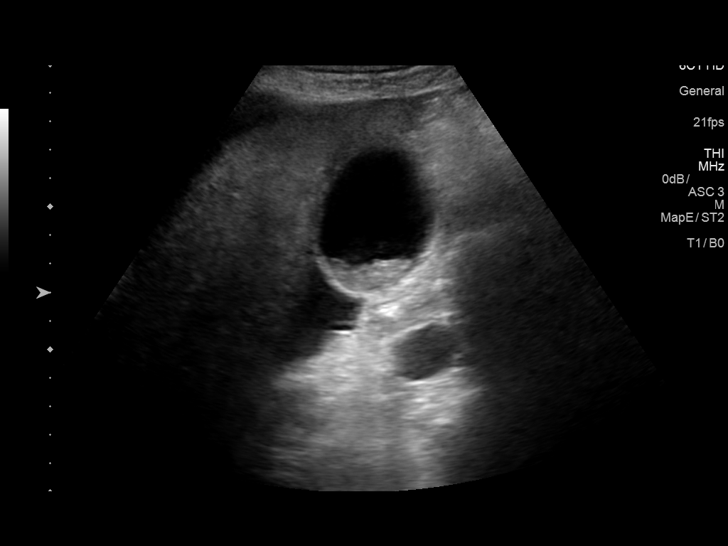
[im 16/94]
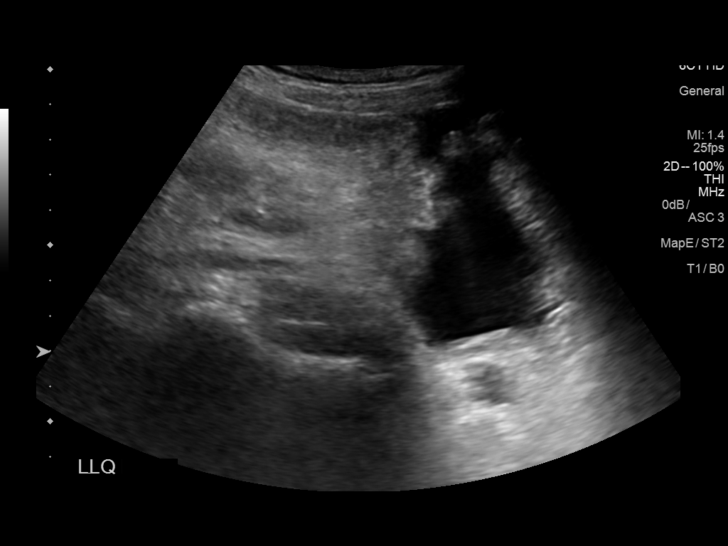
[im 24/94]
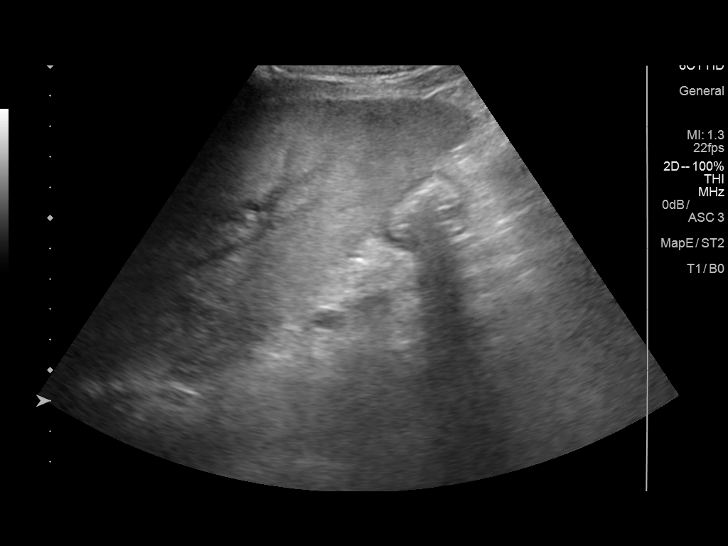
[im 32/94]
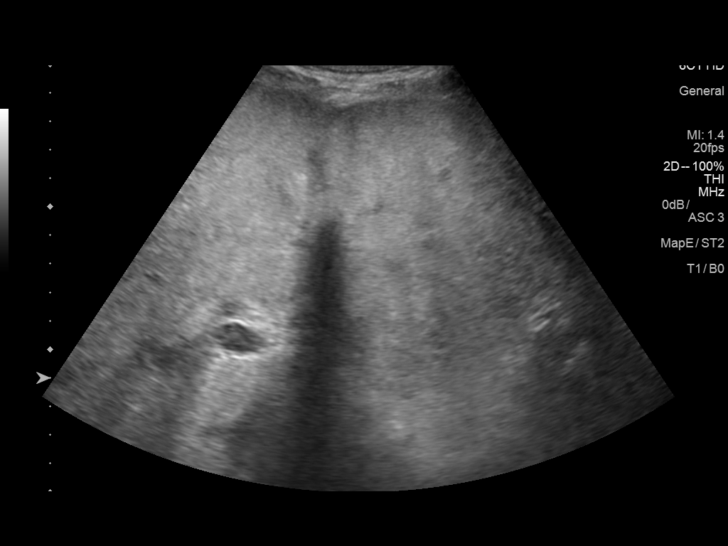
[im 39/94]
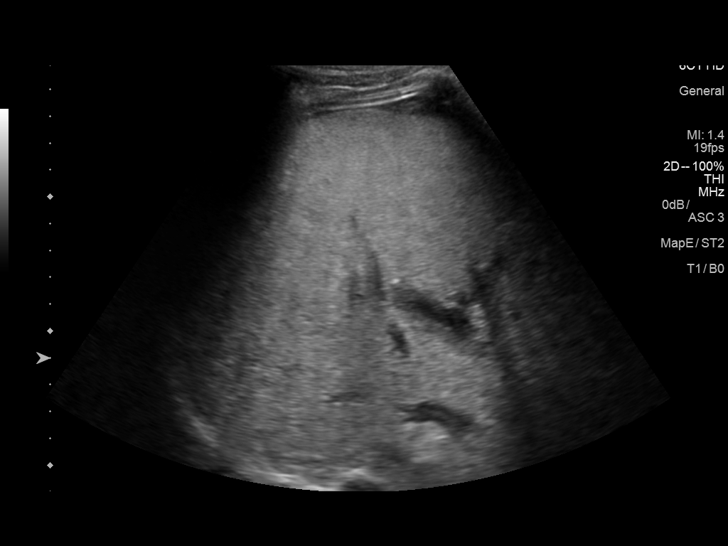
[im 47/94]
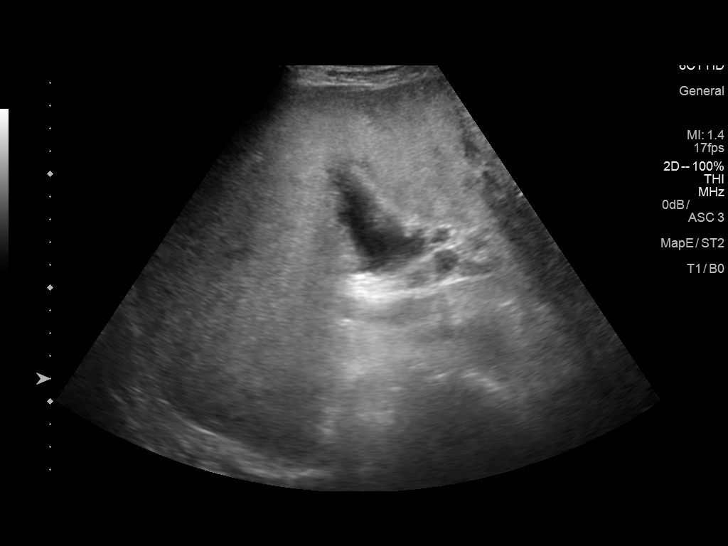
[im 55/94]
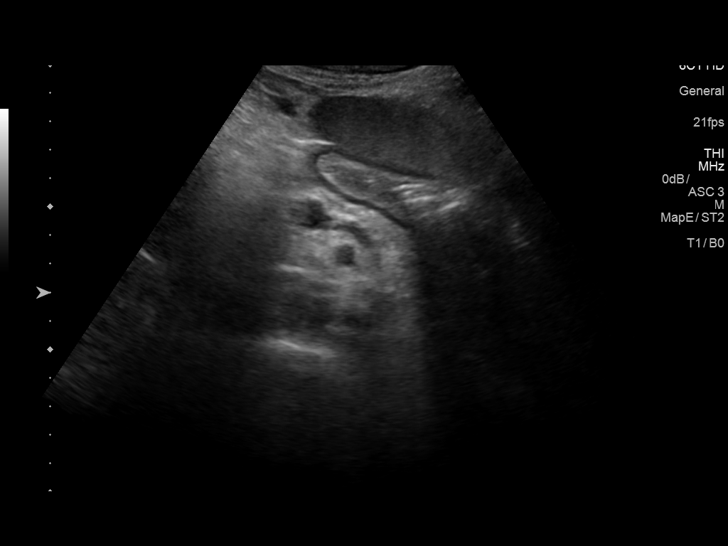
[im 63/94]
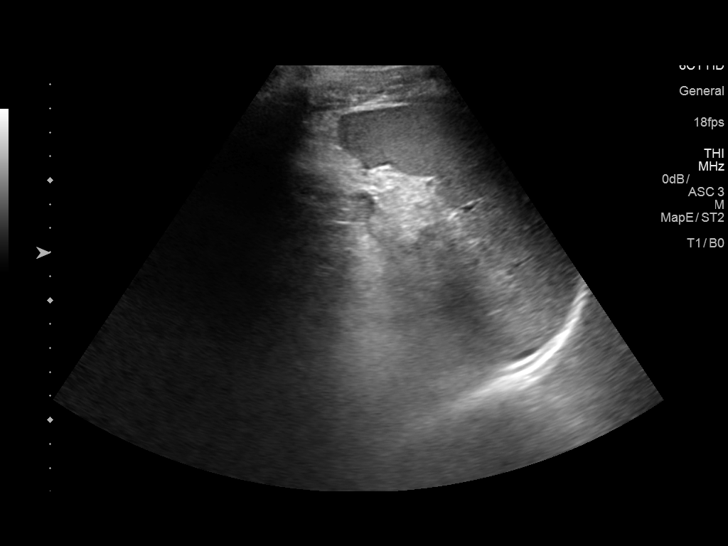
[im 70/94]
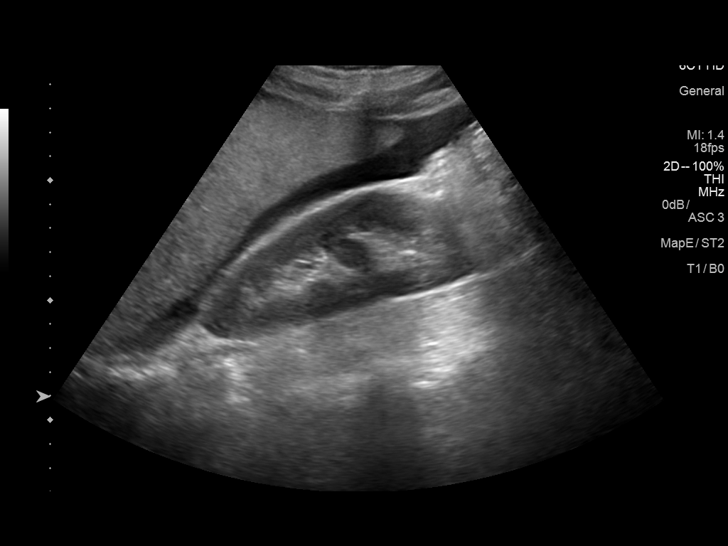
[im 78/94]
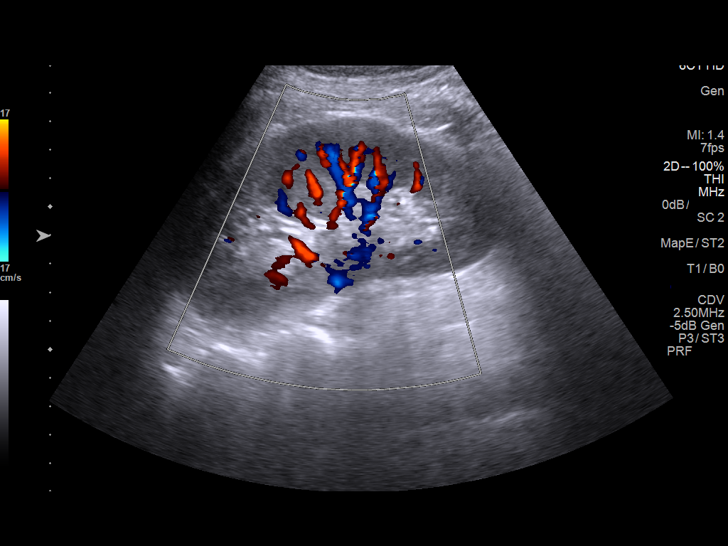
[im 86/94]
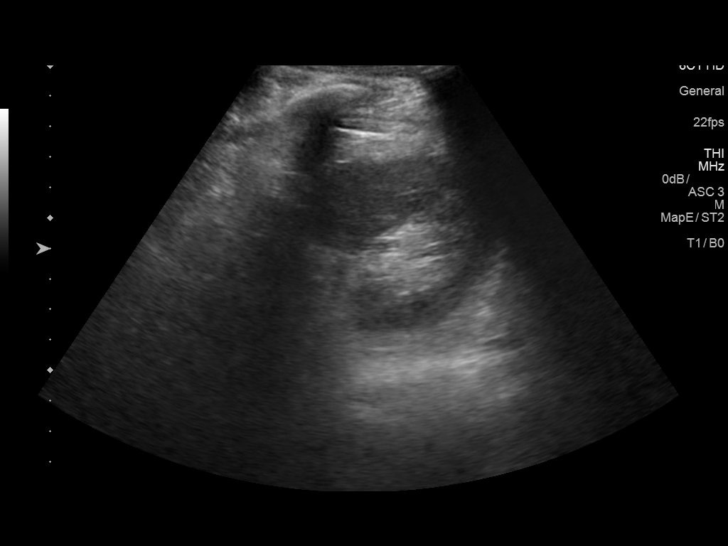
[im 94/94]
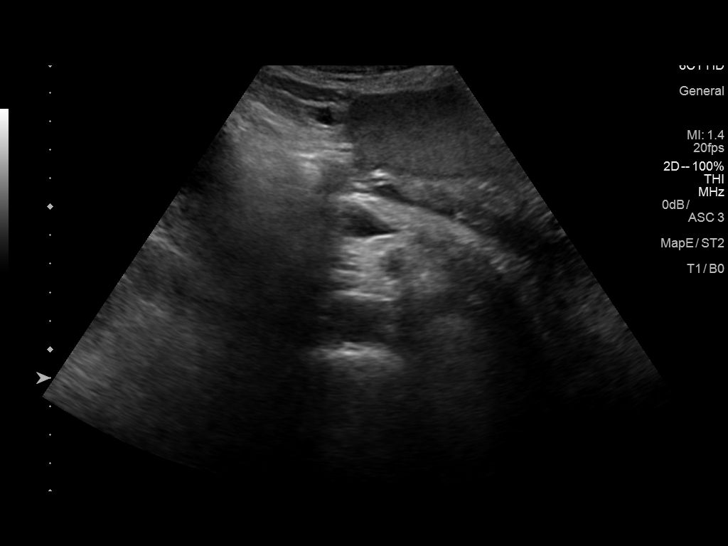

[13 of 25 positions shown; findings below may reference images not displayed]

FINDINGS: Gallbladder: Areas of gallbladder wall thickening possibly
representing adherent sludge. This limits evaluation for primary
gallbladder abnormality. Fluid surrounds the gallbladder. Patient
was not tender over the gallbladder during scanning per sonographer.

Common bile duct: Diameter: 7.1 mm

Liver: Liver of increased echogenicity consistent with fatty
infiltration and/or hepatocellular disease. No focal hepatic lesion.
Liver appears enlarged. Portal vein is patent on color Doppler
imaging with normal direction of blood flow towards the liver.

IVC: No abnormality visualized.

Pancreas: Suboptimally evaluated secondary to habitus and bowel gas.
Portions visualized unremarkable.

Spleen: No focal mass or enlargement.

Right Kidney: Length: 12.0 cm. Echogenicity within normal limits. No
mass or hydronephrosis visualized.

Left Kidney: Length: 11.3 cm.. Echogenicity within normal limits. No
mass or hydronephrosis visualized.

Abdominal aorta: No aneurysm visualized.

Other findings: Small to slightly moderate amount of ascites.
IMPRESSION: Areas of gallbladder wall thickening possibly representing adherent
sludge. This limits evaluation for detection of primary gallbladder
wall abnormality. This can be reassessed on follow-up sonogram in 6
months (sooner if clinically indicated).

Enlarged liver of increased echogenicity consistent with fatty
infiltration and/or hepatocellular disease. No focal hepatic lesion.

Prominent common bile duct measures up to 7.1 mm without evidence of
common bile duct stone. If further delineation is clinically
desired, MRCP can be performed.

Pancreas suboptimally evaluated secondary to bowel gas. The portions
which are visualized appear within normal limits.

Small to slightly moderate amount of ascites.
